# Patient Record
Sex: Male | Born: 1965 | Race: White | Hispanic: No | Smoking: Former smoker
Health system: Southern US, Community
[De-identification: ages and names within clinical notes are randomized; demographics above are authoritative.]

## PROBLEM LIST (undated history)

## (undated) DIAGNOSIS — I10 Essential (primary) hypertension: Secondary | ICD-10-CM

## (undated) DIAGNOSIS — D696 Thrombocytopenia, unspecified: Secondary | ICD-10-CM

## (undated) DIAGNOSIS — I219 Acute myocardial infarction, unspecified: Secondary | ICD-10-CM

## (undated) DIAGNOSIS — E119 Type 2 diabetes mellitus without complications: Secondary | ICD-10-CM

## (undated) DIAGNOSIS — K7460 Unspecified cirrhosis of liver: Secondary | ICD-10-CM

## (undated) DIAGNOSIS — G4730 Sleep apnea, unspecified: Secondary | ICD-10-CM

## (undated) DIAGNOSIS — C801 Malignant (primary) neoplasm, unspecified: Secondary | ICD-10-CM

## (undated) DIAGNOSIS — I209 Angina pectoris, unspecified: Secondary | ICD-10-CM

## (undated) DIAGNOSIS — E785 Hyperlipidemia, unspecified: Secondary | ICD-10-CM

## (undated) HISTORY — PX: TONSILLECTOMY: SUR1361

## (undated) HISTORY — DX: Type 2 diabetes mellitus without complications: E11.9

## (undated) HISTORY — PX: COLONOSCOPY: SHX174

## (undated) HISTORY — PX: UPPER GASTROINTESTINAL ENDOSCOPY: SHX188

## (undated) HISTORY — PX: APPENDECTOMY: SHX54

## (undated) HISTORY — PX: WRIST SURGERY: SHX841

## (undated) HISTORY — PX: REPLACEMENT TOTAL KNEE: SUR1224

## (undated) HISTORY — PX: CARDIAC CATHETERIZATION: SHX172

## (undated) HISTORY — PX: CHOLECYSTECTOMY: SHX55

## (undated) HISTORY — DX: Essential (primary) hypertension: I10

## (undated) HISTORY — DX: Hyperlipidemia, unspecified: E78.5

## (undated) HISTORY — PX: TONSILLECTOMY AND ADENOIDECTOMY: SHX28

## (undated) HISTORY — DX: Acute myocardial infarction, unspecified: I21.9

## (undated) HISTORY — DX: Malignant (primary) neoplasm, unspecified: C80.1

---

## 2021-06-21 ENCOUNTER — Ambulatory Visit: Payer: Self-pay | Admitting: Internal Medicine

## 2021-07-13 DIAGNOSIS — N289 Disorder of kidney and ureter, unspecified: Secondary | ICD-10-CM | POA: Insufficient documentation

## 2021-07-13 DIAGNOSIS — G8929 Other chronic pain: Secondary | ICD-10-CM | POA: Insufficient documentation

## 2021-07-13 DIAGNOSIS — I872 Venous insufficiency (chronic) (peripheral): Secondary | ICD-10-CM | POA: Insufficient documentation

## 2021-07-13 DIAGNOSIS — N400 Enlarged prostate without lower urinary tract symptoms: Secondary | ICD-10-CM | POA: Insufficient documentation

## 2021-07-13 DIAGNOSIS — I2510 Atherosclerotic heart disease of native coronary artery without angina pectoris: Secondary | ICD-10-CM | POA: Insufficient documentation

## 2021-07-13 DIAGNOSIS — R600 Localized edema: Secondary | ICD-10-CM | POA: Insufficient documentation

## 2021-07-13 DIAGNOSIS — Z9989 Dependence on other enabling machines and devices: Secondary | ICD-10-CM | POA: Insufficient documentation

## 2021-07-13 DIAGNOSIS — Z96652 Presence of left artificial knee joint: Secondary | ICD-10-CM | POA: Insufficient documentation

## 2021-07-13 DIAGNOSIS — I8510 Secondary esophageal varices without bleeding: Secondary | ICD-10-CM | POA: Insufficient documentation

## 2021-07-13 DIAGNOSIS — F5101 Primary insomnia: Secondary | ICD-10-CM | POA: Insufficient documentation

## 2021-07-13 DIAGNOSIS — R609 Edema, unspecified: Secondary | ICD-10-CM | POA: Insufficient documentation

## 2021-07-13 DIAGNOSIS — R7989 Other specified abnormal findings of blood chemistry: Secondary | ICD-10-CM | POA: Insufficient documentation

## 2021-07-13 DIAGNOSIS — E790 Hyperuricemia without signs of inflammatory arthritis and tophaceous disease: Secondary | ICD-10-CM | POA: Insufficient documentation

## 2021-07-13 DIAGNOSIS — K7460 Unspecified cirrhosis of liver: Secondary | ICD-10-CM | POA: Insufficient documentation

## 2021-07-19 DIAGNOSIS — E119 Type 2 diabetes mellitus without complications: Secondary | ICD-10-CM | POA: Insufficient documentation

## 2021-07-19 DIAGNOSIS — D696 Thrombocytopenia, unspecified: Secondary | ICD-10-CM | POA: Insufficient documentation

## 2021-07-20 ENCOUNTER — Other Ambulatory Visit: Payer: Self-pay | Admitting: Family Medicine

## 2021-07-20 DIAGNOSIS — K7460 Unspecified cirrhosis of liver: Secondary | ICD-10-CM

## 2021-07-20 DIAGNOSIS — E119 Type 2 diabetes mellitus without complications: Secondary | ICD-10-CM

## 2021-07-27 ENCOUNTER — Other Ambulatory Visit: Payer: Self-pay

## 2021-07-27 ENCOUNTER — Ambulatory Visit
Admission: RE | Admit: 2021-07-27 | Discharge: 2021-07-27 | Disposition: A | Discharge status: Home / Self Care | Payer: Medicare Other | Source: Ambulatory Visit | Attending: Family Medicine | Admitting: Family Medicine

## 2021-07-27 DIAGNOSIS — K7460 Unspecified cirrhosis of liver: Secondary | ICD-10-CM

## 2021-07-27 DIAGNOSIS — E119 Type 2 diabetes mellitus without complications: Secondary | ICD-10-CM

## 2021-07-27 IMAGING — US US ABDOMEN COMPLETE
1 series · 13 of 25 positions shown · non-contrast
Comparison: None.

CLINICAL DATA: Hepatic cirrhosis.

EXAM:
ABDOMEN ULTRASOUND COMPLETE

[Series 1: us abdomen complete · 0.34mm/px · 13 of 137 slices shown]
[im 1/137]
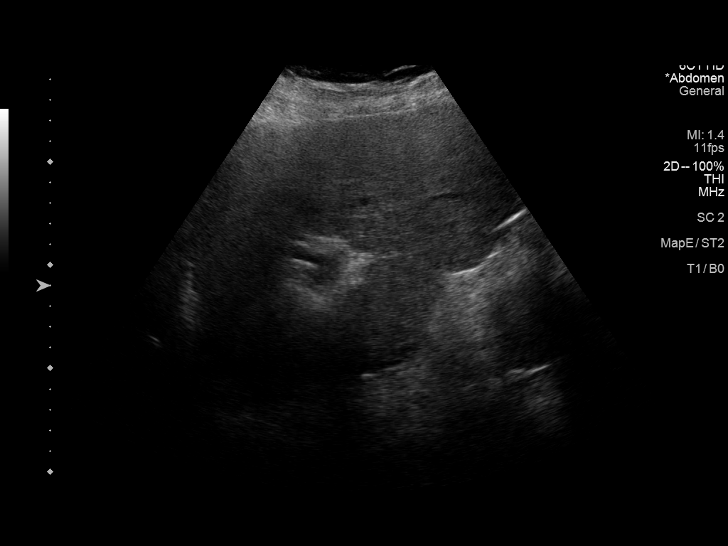
[im 12/137]
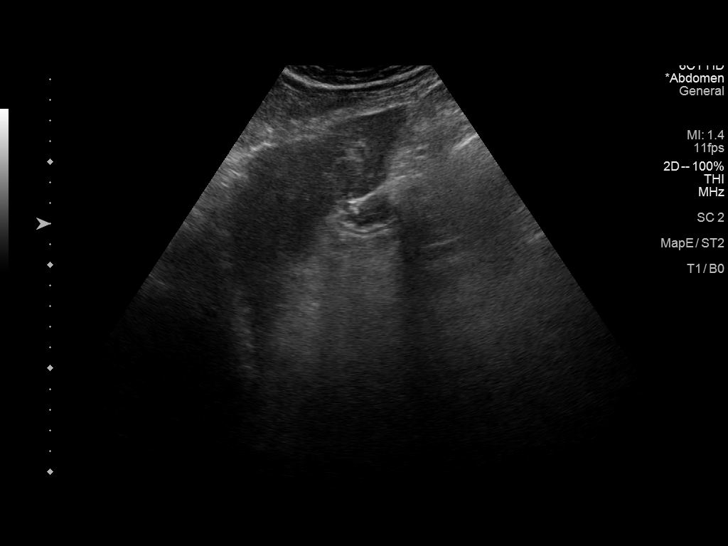
[im 23/137]
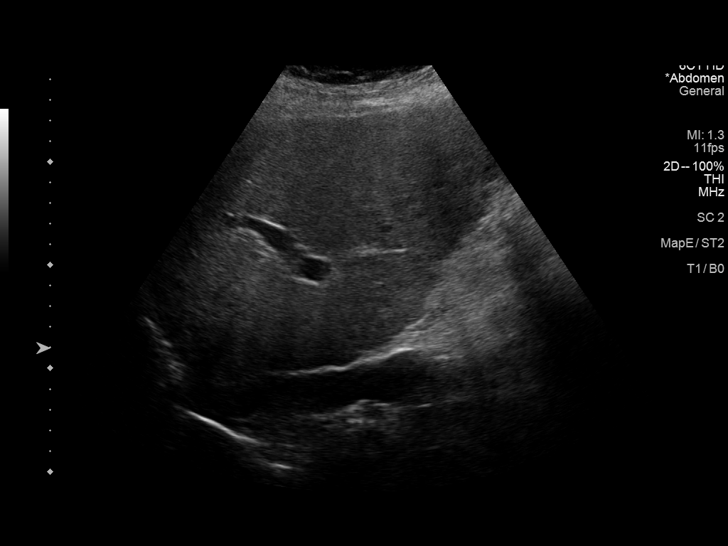
[im 35/137]
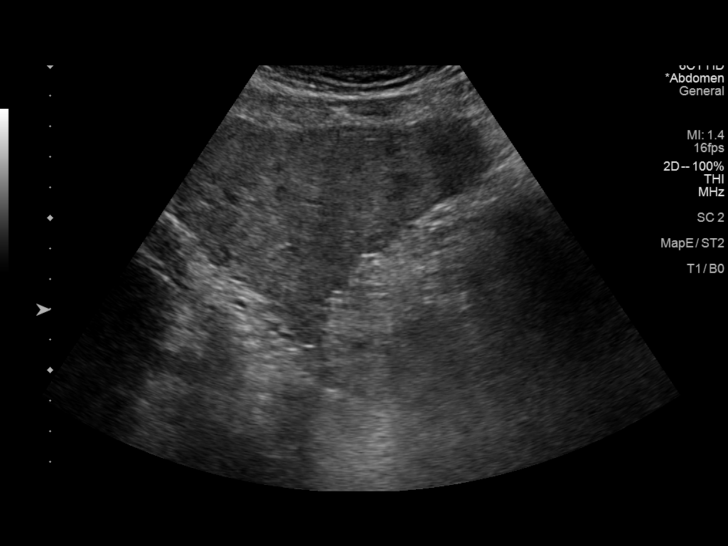
[im 46/137]
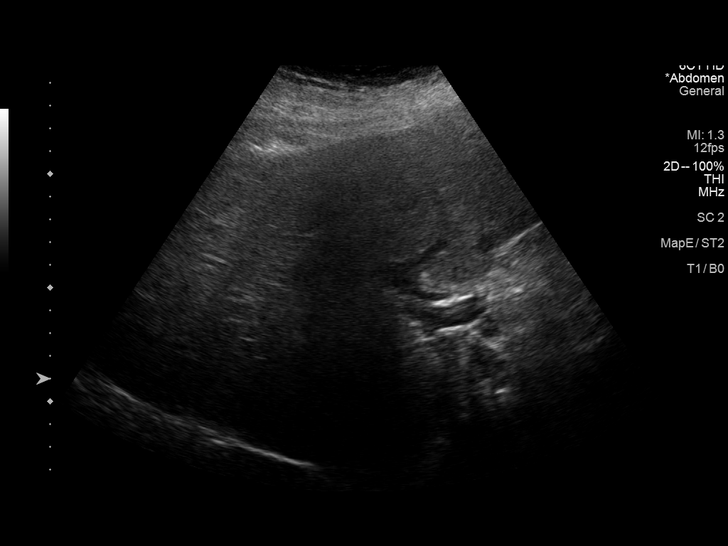
[im 57/137]
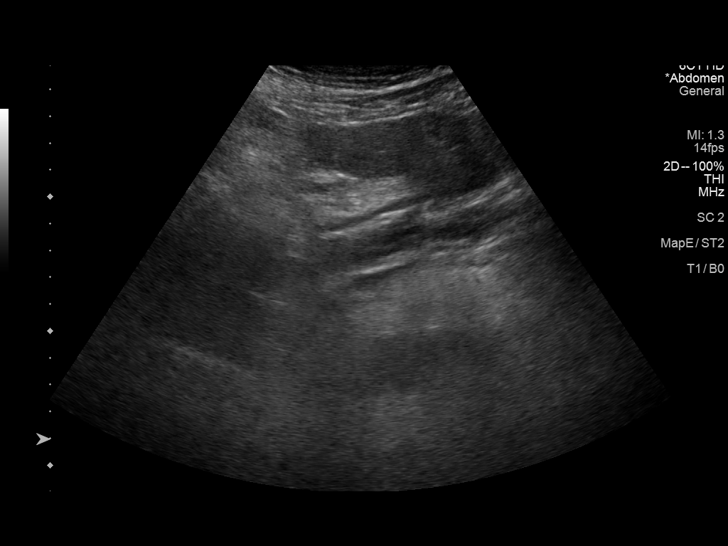
[im 69/137]
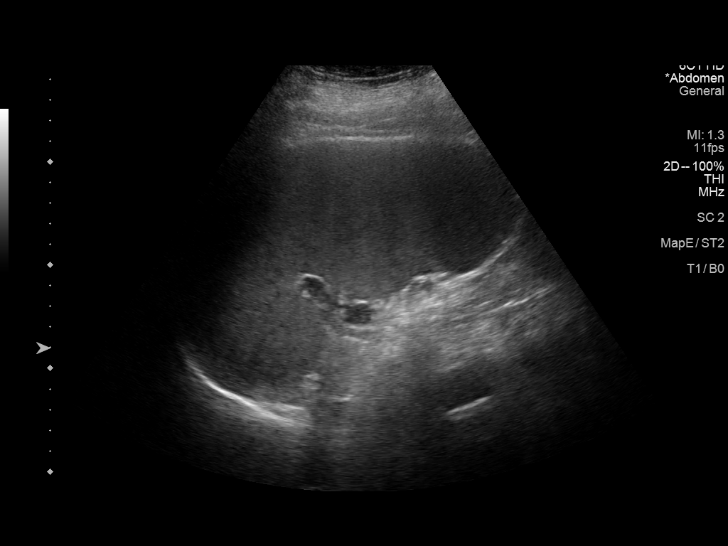
[im 80/137]
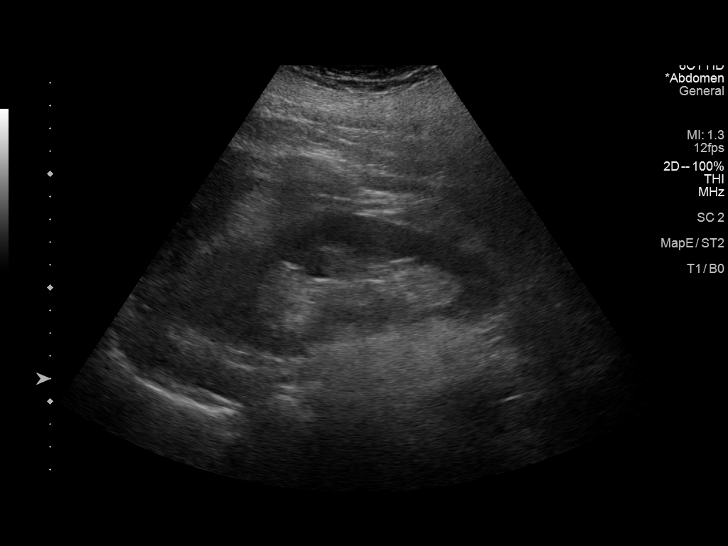
[im 91/137]
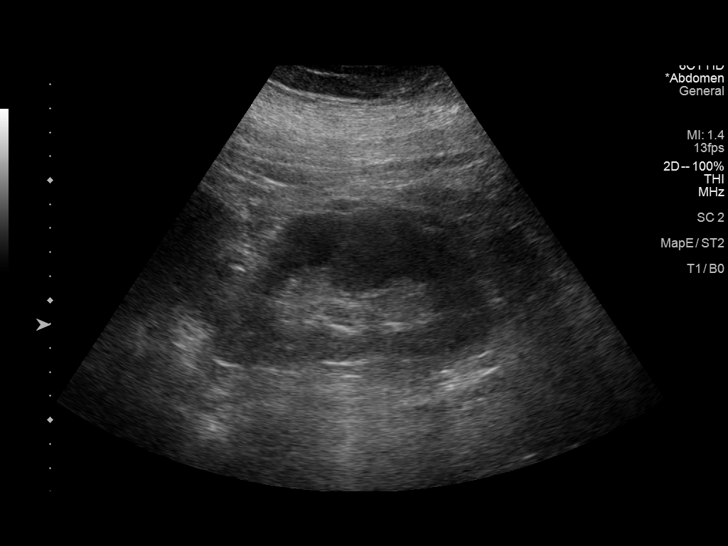
[im 103/137]
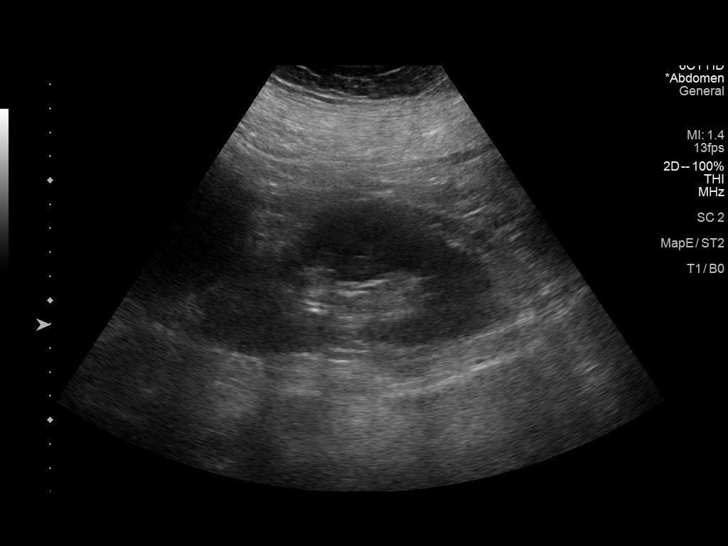
[im 114/137]
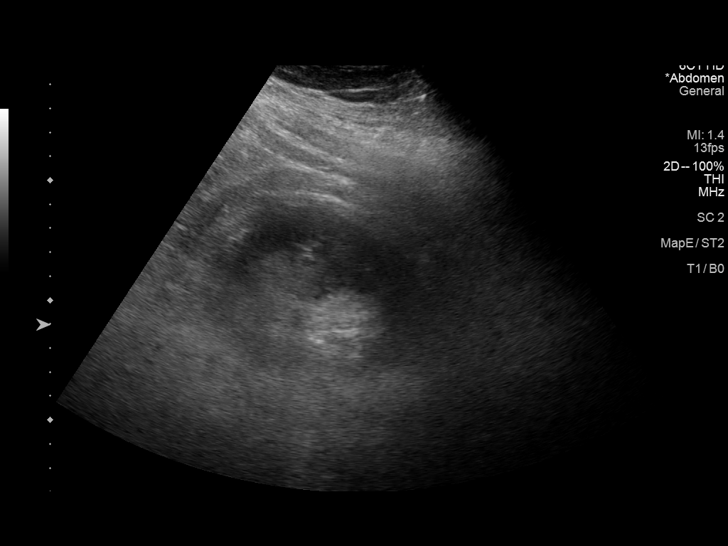
[im 125/137]
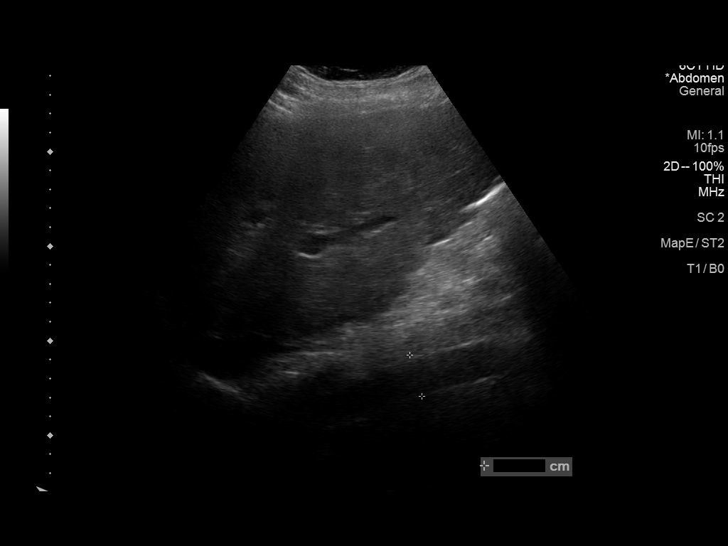
[im 137/137]
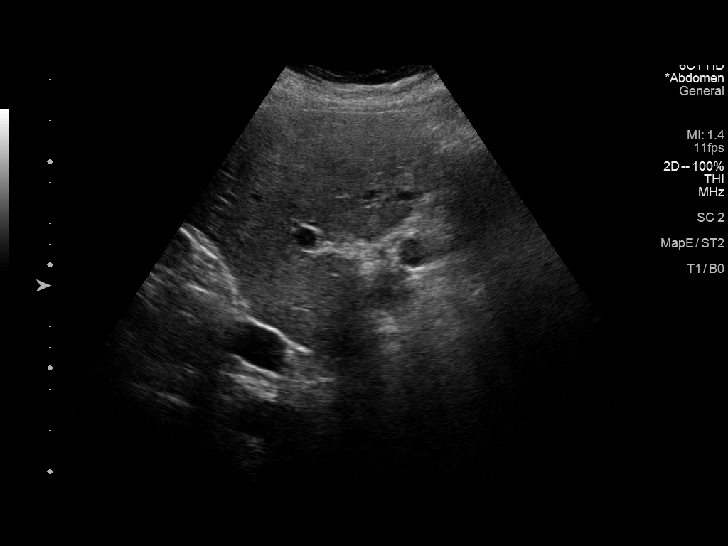

[13 of 25 positions shown; findings below may reference images not displayed]

FINDINGS: Gallbladder: Surgically absent.

Common bile duct: Diameter: 14 mm.

Liver: The liver has a nodular contour it is diffusely heterogeneous
and coarse. There is a 2.6 by 2.2 x 2.4 cm mass in the left lobe.
Portal vein is patent on color Doppler imaging with normal direction
of blood flow towards the liver.

IVC: No abnormality visualized.

Pancreas: Visualized portion unremarkable.

Spleen: Enlarged measuring 14.4 x 14.4 x 12.3 cm.

Right Kidney: Length: 14.9 cm. Echogenicity within normal limits. No
mass or hydronephrosis visualized.

Left Kidney: Length: 12.0 cm. Echogenicity within normal limits. No
hydronephrosis. There is a mildly echogenic heterogeneous mass in
the mid left kidney measuring 2.8 x 3.4 x 3.1 cm.

Abdominal aorta: No aneurysm visualized.  Limited evaluation.

Other findings: No ascites.
IMPRESSION: 1. Cirrhotic liver. 2.4 cm mass in the left lobe of the liver,
indeterminate. Recommend further characterization with MRI.
2. No ascites.
3. Splenomegaly.
4. 3.4 cm echogenic mass in the left kidney is indeterminate.
Recommend further evaluation with MRI.

## 2021-08-01 ENCOUNTER — Ambulatory Visit: Payer: Self-pay | Admitting: Internal Medicine

## 2021-08-01 ENCOUNTER — Other Ambulatory Visit: Payer: Self-pay | Admitting: Family Medicine

## 2021-08-01 DIAGNOSIS — K7460 Unspecified cirrhosis of liver: Secondary | ICD-10-CM

## 2021-08-01 DIAGNOSIS — R160 Hepatomegaly, not elsewhere classified: Secondary | ICD-10-CM

## 2021-08-01 DIAGNOSIS — N2889 Other specified disorders of kidney and ureter: Secondary | ICD-10-CM | POA: Insufficient documentation

## 2021-08-02 ENCOUNTER — Other Ambulatory Visit: Payer: Self-pay

## 2021-08-02 ENCOUNTER — Ambulatory Visit
Admission: RE | Admit: 2021-08-02 | Discharge: 2021-08-02 | Disposition: A | Discharge status: Home / Self Care | Payer: Medicare Other | Source: Ambulatory Visit | Attending: Family Medicine | Admitting: Family Medicine

## 2021-08-02 DIAGNOSIS — M25552 Pain in left hip: Secondary | ICD-10-CM | POA: Insufficient documentation

## 2021-08-02 DIAGNOSIS — I890 Lymphedema, not elsewhere classified: Secondary | ICD-10-CM | POA: Insufficient documentation

## 2021-08-02 DIAGNOSIS — K7460 Unspecified cirrhosis of liver: Secondary | ICD-10-CM

## 2021-08-02 DIAGNOSIS — I898 Other specified noninfective disorders of lymphatic vessels and lymph nodes: Secondary | ICD-10-CM | POA: Insufficient documentation

## 2021-08-02 DIAGNOSIS — R160 Hepatomegaly, not elsewhere classified: Secondary | ICD-10-CM

## 2021-08-02 IMAGING — MR MR ABDOMEN WO/W CM
18 series · 48 of 48 positions shown · IV contrast (multihance)
Comparison: Ultrasound on [DATE]

CLINICAL DATA: Cirrhosis. Possible hepatic and left renal masses on
recent ultrasound.

EXAM:
MRI ABDOMEN WITHOUT AND WITH CONTRAST
TECHNIQUE: Multiplanar multisequence MR imaging of the abdomen was performed
both before and after the administration of intravenous contrast.
CONTRAST:  20mL MULTIHANCE GADOBENATE DIMEGLUMINE 529 MG/ML IV SOLN

[Series 3: T2 · coronal · 5.0mm · 1.95mm/px · 1 of 41 slices shown (1 of 4)]
[im 1/41]
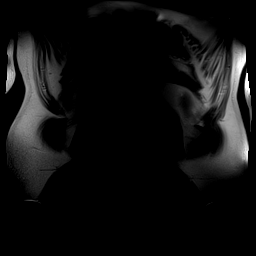

[Series 4: T1 · axial · 3.0mm · 1.41mm/px · z∈[-150,+108]mm · 5 of 176 slices shown]
[im 1/176]
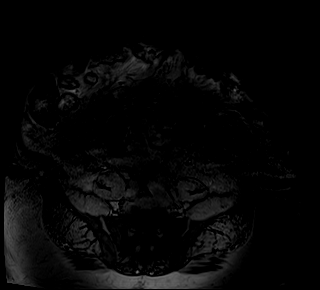
[im 44/176]
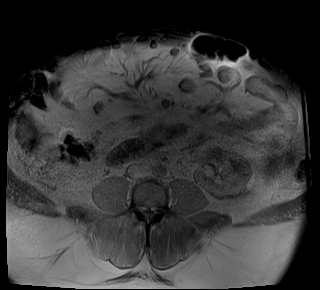
[im 88/176]
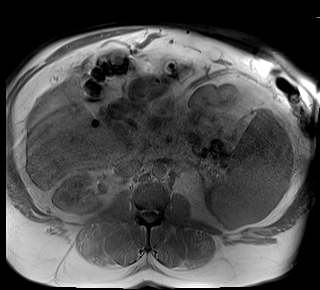
[im 132/176]
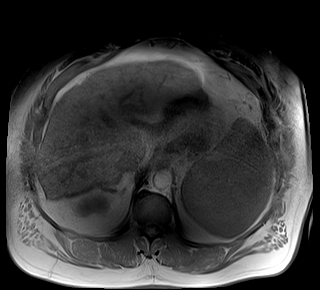
[im 176/176]
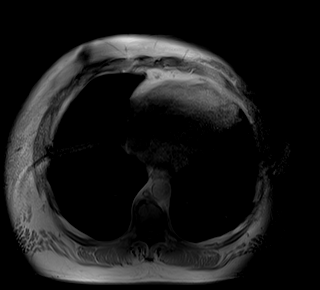

[Series 5: T2 · axial · 5.0mm · 1.76mm/px · z∈[-110,+152]mm · 2 of 45 slices shown (2 of 4)]
[im 1/45]
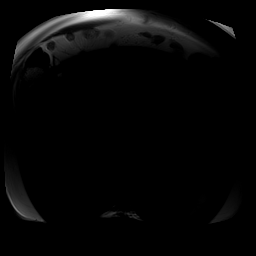
[im 45/45]
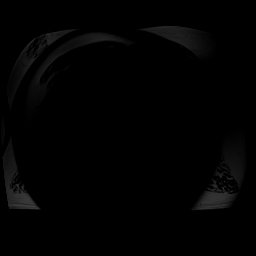

[Series 6: bSSFP · axial · 5.0mm · 1.41mm/px · 1 of 43 slices shown]
[im 1/43]
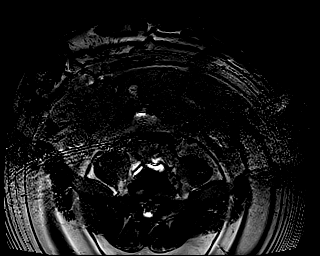

[Series 7: DWI · axial · 5.0mm · 1.68mm/px · z∈[-114,+148]mm · 5 of 135 slices shown (1 of 2)]
[im 1/135]
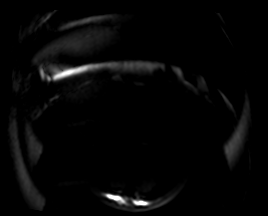
[im 34/135]
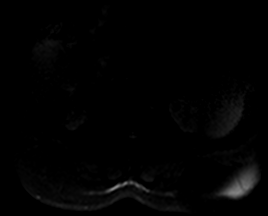
[im 68/135]
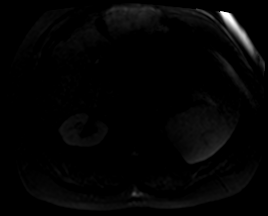
[im 101/135]
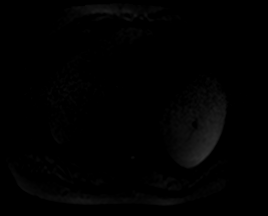
[im 135/135]
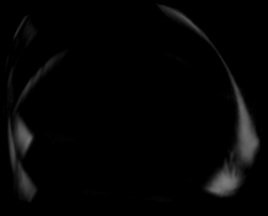

[Series 8: DWI · axial · 5.0mm · 1.68mm/px · z∈[-114,+148]mm · 2 of 45 slices shown (2 of 2)]
[im 1/45]
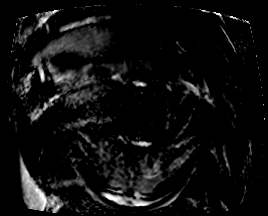
[im 45/45]
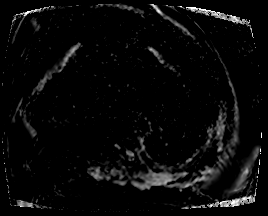

[Series 9: T2 · axial · 6.0mm · 1.41mm/px · 1 of 34 slices shown (3 of 4)]
[im 1/34]
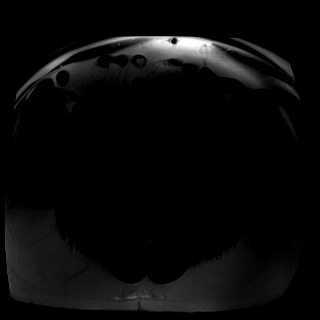

[Series 11: T2 · axial · 6.0mm · 1.41mm/px · 1 of 35 slices shown (4 of 4)]
[im 1/35]
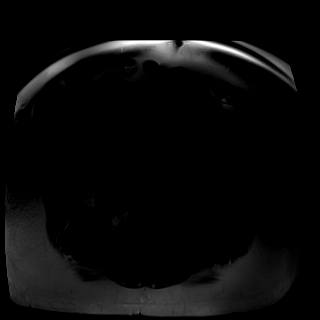

[Series 12: T1 dynamic · axial · non-contrast · 3.0mm · 1.41mm/px · z∈[-145,+113]mm · 3 of 88 slices shown]
[im 1/88]
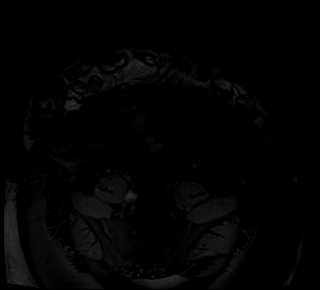
[im 44/88]
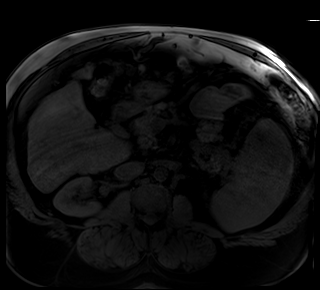
[im 88/88]
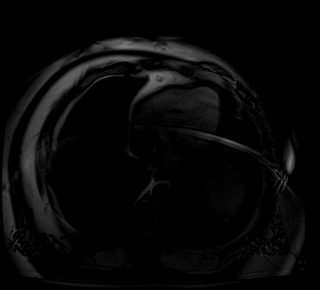

[Series 13: T1 dynamic post-contrast · axial · 3.0mm · 1.41mm/px · z∈[-145,+113]mm · 3 of 88 slices shown (1 of 9)]
[im 1/88]
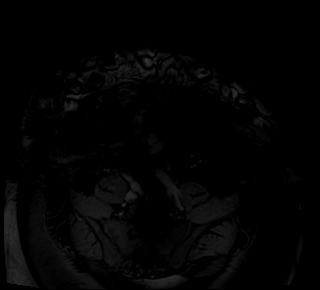
[im 44/88]
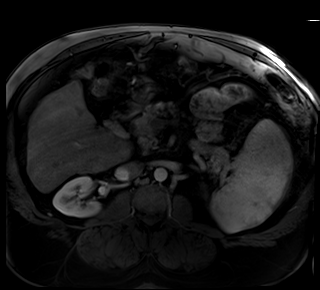
[im 88/88]
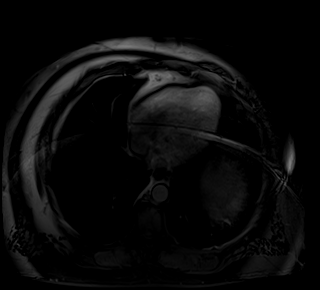

[Series 14: T1 dynamic post-contrast · axial · 3.0mm · 1.41mm/px · z∈[-145,+113]mm · 3 of 88 slices shown (2 of 9)]
[im 1/88]
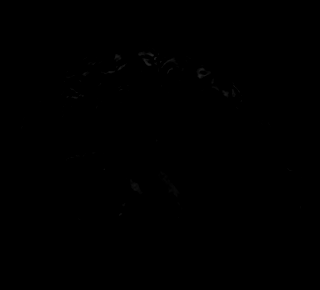
[im 44/88]
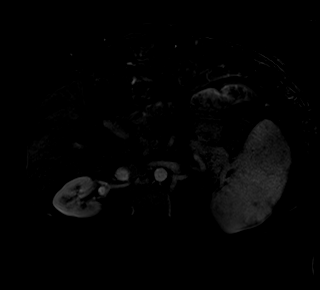
[im 88/88]
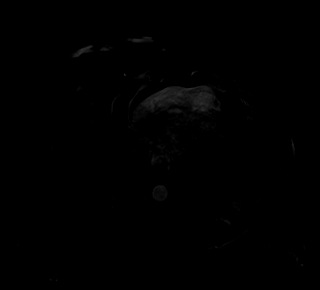

[Series 15: T1 dynamic post-contrast · axial · 3.0mm · 1.41mm/px · z∈[-145,+113]mm · 3 of 88 slices shown (3 of 9)]
[im 1/88]
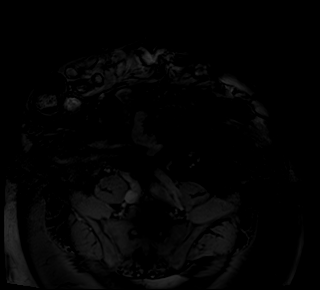
[im 44/88]
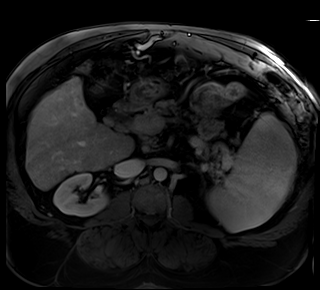
[im 88/88]
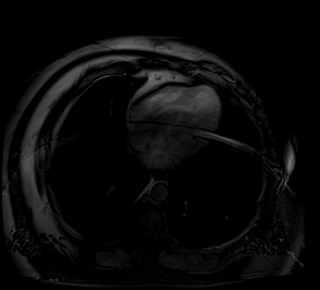

[Series 16: T1 dynamic post-contrast · axial · 3.0mm · 1.41mm/px · z∈[-145,+113]mm · 3 of 88 slices shown (4 of 9)]
[im 1/88]
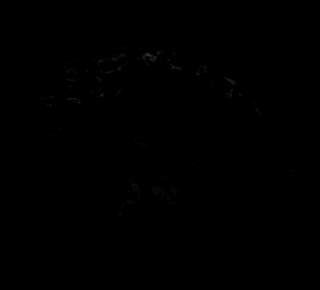
[im 44/88]
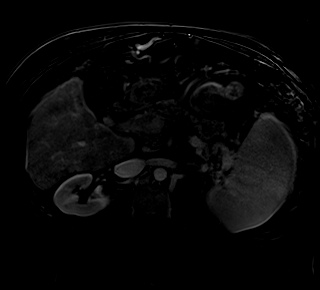
[im 88/88]
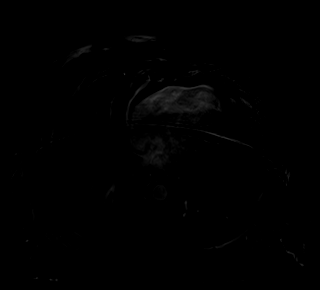

[Series 17: T1 dynamic post-contrast · axial · 3.0mm · 1.41mm/px · z∈[-145,+113]mm · 3 of 88 slices shown (5 of 9)]
[im 1/88]
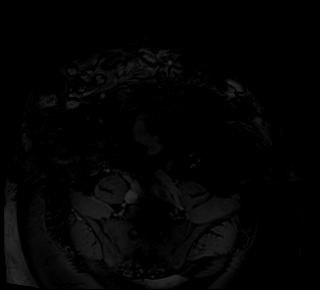
[im 44/88]
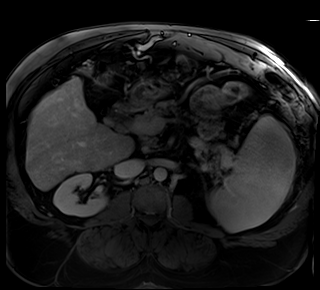
[im 88/88]
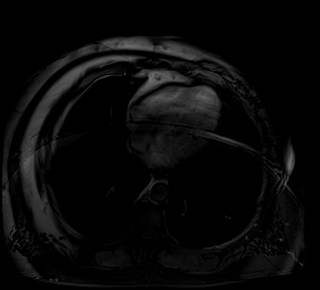

[Series 18: T1 dynamic post-contrast · axial · 3.0mm · 1.41mm/px · z∈[-145,+113]mm · 3 of 88 slices shown (6 of 9)]
[im 1/88]
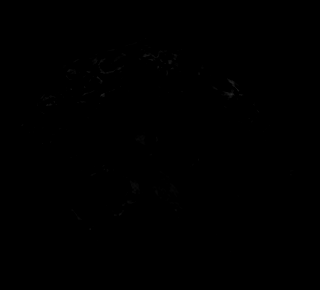
[im 44/88]
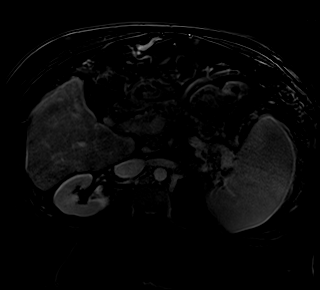
[im 88/88]
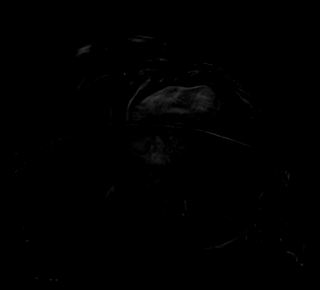

[Series 19: T1 dynamic post-contrast · coronal · 3.0mm · 1.41mm/px · 3 of 88 slices shown (7 of 9)]
[im 1/88]
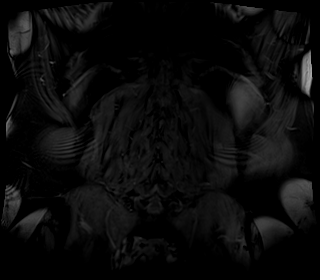
[im 44/88]
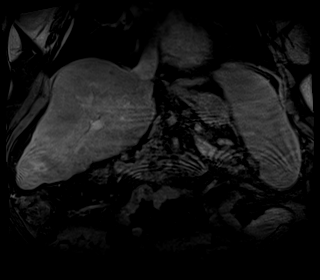
[im 88/88]
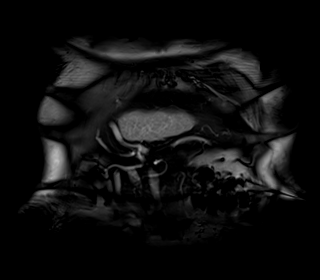

[Series 20: T1 dynamic post-contrast · axial · 3.0mm · 1.41mm/px · z∈[-145,+113]mm · 3 of 88 slices shown (8 of 9)]
[im 1/88]
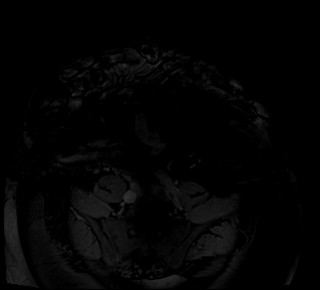
[im 44/88]
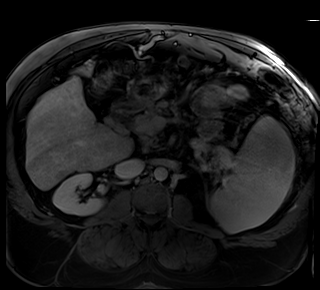
[im 88/88]
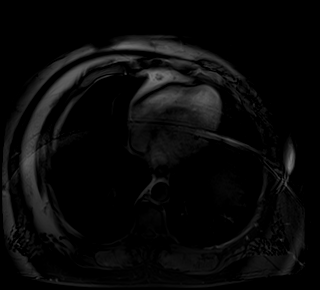

[Series 21: T1 dynamic post-contrast · axial · 3.0mm · 1.41mm/px · z∈[-145,+113]mm · 3 of 88 slices shown (9 of 9)]
[im 1/88]
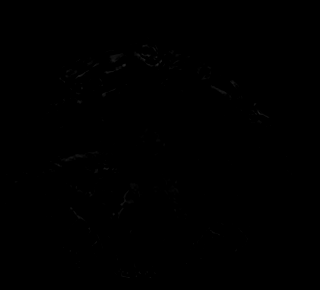
[im 44/88]
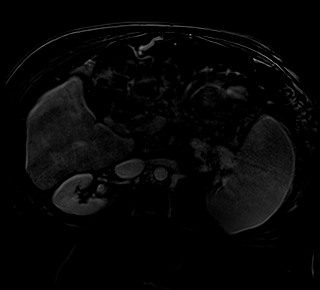
[im 88/88]
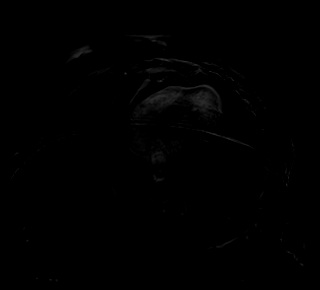

[48 of 48 positions shown; findings below may reference images not displayed]

FINDINGS: Lower chest: No acute findings.

Hepatobiliary: Hepatic cirrhosis is demonstrated. A 2.7 x 2.3 cm
mass is seen in the posterior liver dome in segment 7, which shows
diffuse arterial phase hyperenhancement, contrast washout, and
delayed peripheral rim enhancement (KADHAFI 5). A 9 mm hypervascular
lesion is also seen showing arterial phase hyperenhancement in
segment 4A of the left lobe, and this also shows contrast washout
and delayed capsular enhancement (e.g. Image 20/20). (KADHAFI 4) a 6
mm nodule is also seen in segment [DATE] on image 35/13, which shows
arterial phase hyperenhancement, but no evidence of contrast washout
or delayed capsular enhancement. (KADHAFI 3).

Pancreas:  No mass or inflammatory changes.

Spleen: Moderate splenomegaly measuring approximately 19 cm in
length. No splenic masses identified.

Adrenals/Urinary Tract: No masses identified. No evidence of
hydronephrosis.

Stomach/Bowel: Visualized portion unremarkable.

Vascular/Lymphatic: No pathologically enlarged lymph nodes
identified. No acute vascular findings. Recanalization of
paraumbilical veins, consistent with portal venous hypertension.

Other:  No evidence of ascites.

Musculoskeletal:  No suspicious bone lesions identified.
IMPRESSION: Hepatic cirrhosis, with findings of portal venous hypertension. No
evidence of ascites.

2.7 cm hypervascular mass in posterior liver dome in segment 7.
(KADHAFI Category 5: Definitely HCC)

9 mm hypervascular mass in segment 4A of the left hepatic lobe.
(KADHAFI Category 4: Probably HCC)

6 mm hypervascular nodule in the anterior segment right hepatic
lobe. (KADHAFI Category 3: Intermediate probability of malignancy)

No evidence of abdominal metastatic disease.

No evidence of left renal mass.

## 2021-08-02 MED ORDER — GADOBENATE DIMEGLUMINE 529 MG/ML IV SOLN
20.0000 mL | Freq: Once | INTRAVENOUS | Status: AC | PRN
  Administered 2021-08-02: 20 mL via INTRAVENOUS

## 2021-08-14 DIAGNOSIS — C220 Liver cell carcinoma: Secondary | ICD-10-CM | POA: Insufficient documentation

## 2021-08-16 DIAGNOSIS — K766 Portal hypertension: Secondary | ICD-10-CM | POA: Insufficient documentation

## 2021-09-08 DIAGNOSIS — R600 Localized edema: Secondary | ICD-10-CM | POA: Insufficient documentation

## 2021-09-22 ENCOUNTER — Emergency Department
Admission: EM | Admit: 2021-09-22 | Discharge: 2021-09-22 | Disposition: A | Discharge status: Home / Self Care | Payer: Medicare Other | Attending: Emergency Medicine | Admitting: Emergency Medicine

## 2021-09-22 ENCOUNTER — Emergency Department: Payer: Medicare Other

## 2021-09-22 ENCOUNTER — Other Ambulatory Visit: Payer: Self-pay

## 2021-09-22 DIAGNOSIS — E785 Hyperlipidemia, unspecified: Secondary | ICD-10-CM | POA: Insufficient documentation

## 2021-09-22 DIAGNOSIS — I890 Lymphedema, not elsewhere classified: Secondary | ICD-10-CM | POA: Diagnosis not present

## 2021-09-22 DIAGNOSIS — G4733 Obstructive sleep apnea (adult) (pediatric): Secondary | ICD-10-CM | POA: Insufficient documentation

## 2021-09-22 DIAGNOSIS — H532 Diplopia: Secondary | ICD-10-CM | POA: Insufficient documentation

## 2021-09-22 DIAGNOSIS — I252 Old myocardial infarction: Secondary | ICD-10-CM | POA: Insufficient documentation

## 2021-09-22 DIAGNOSIS — D696 Thrombocytopenia, unspecified: Secondary | ICD-10-CM | POA: Diagnosis not present

## 2021-09-22 DIAGNOSIS — I2510 Atherosclerotic heart disease of native coronary artery without angina pectoris: Secondary | ICD-10-CM | POA: Insufficient documentation

## 2021-09-22 DIAGNOSIS — E119 Type 2 diabetes mellitus without complications: Secondary | ICD-10-CM | POA: Diagnosis not present

## 2021-09-22 DIAGNOSIS — C220 Liver cell carcinoma: Secondary | ICD-10-CM | POA: Insufficient documentation

## 2021-09-22 DIAGNOSIS — H4922 Sixth [abducent] nerve palsy, left eye: Secondary | ICD-10-CM | POA: Diagnosis not present

## 2021-09-22 DIAGNOSIS — I10 Essential (primary) hypertension: Secondary | ICD-10-CM | POA: Diagnosis not present

## 2021-09-22 LAB — CBC
HCT: 40.5 % (ref 39.0–52.0)
Hemoglobin: 14.3 g/dL (ref 13.0–17.0)
MCH: 31.6 pg (ref 26.0–34.0)
MCHC: 35.3 g/dL (ref 30.0–36.0)
MCV: 89.4 fL (ref 80.0–100.0)
Platelets: 63 10*3/uL — ABNORMAL LOW (ref 150–400)
RBC: 4.53 MIL/uL (ref 4.22–5.81)
RDW: 13.2 % (ref 11.5–15.5)
WBC: 4.7 10*3/uL (ref 4.0–10.5)
nRBC: 0 % (ref 0.0–0.2)

## 2021-09-22 LAB — COMPREHENSIVE METABOLIC PANEL
ALT: 40 U/L (ref 0–44)
AST: 71 U/L — ABNORMAL HIGH (ref 15–41)
Albumin: 3.4 g/dL — ABNORMAL LOW (ref 3.5–5.0)
Alkaline Phosphatase: 135 U/L — ABNORMAL HIGH (ref 38–126)
Anion gap: 5 (ref 5–15)
BUN: 13 mg/dL (ref 6–20)
CO2: 26 mmol/L (ref 22–32)
Calcium: 8.5 mg/dL — ABNORMAL LOW (ref 8.9–10.3)
Chloride: 107 mmol/L (ref 98–111)
Creatinine, Ser: 0.85 mg/dL (ref 0.61–1.24)
GFR, Estimated: >60 mL/min (ref >60)
Glucose, Bld: 63 mg/dL — ABNORMAL LOW (ref 70–99)
Potassium: 3.3 mmol/L — ABNORMAL LOW (ref 3.5–5.1)
Sodium: 138 mmol/L (ref 135–145)
Total Bilirubin: 2.2 mg/dL — ABNORMAL HIGH (ref 0.3–1.2)
Total Protein: 7.5 g/dL (ref 6.5–8.1)

## 2021-09-22 LAB — CSF CELL COUNT WITH DIFFERENTIAL
Eosinophils, CSF: 0 %
Eosinophils, CSF: 0 %
Lymphs, CSF: 0 %
Lymphs, CSF: 57 %
Monocyte-Macrophage-Spinal Fluid: 14 %
Monocyte-Macrophage-Spinal Fluid: 50 %
Other Cells, CSF: 0
Other Cells, CSF: 0
RBC Count, CSF: 57 /mm3 — ABNORMAL HIGH (ref 0–3)
RBC Count, CSF: 5772 /mm3 — ABNORMAL HIGH (ref 0–3)
Segmented Neutrophils-CSF: 29 %
Segmented Neutrophils-CSF: 50 %
Tube #: 1
Tube #: 4
WBC, CSF: 43 /mm3 (ref 0–5)
WBC, CSF: 8 /mm3 — ABNORMAL HIGH (ref 0–5)

## 2021-09-22 LAB — PROTIME-INR
INR: 1.3 — ABNORMAL HIGH (ref 0.8–1.2)
Prothrombin Time: 15.8 seconds — ABNORMAL HIGH (ref 11.4–15.2)

## 2021-09-22 LAB — GLUCOSE, CSF: Glucose, CSF: 56 mg/dL (ref 40–70)

## 2021-09-22 LAB — TSH: TSH: 2.089 u[IU]/mL (ref 0.350–4.500)

## 2021-09-22 LAB — PROTEIN, CSF: Total  Protein, CSF: 49 mg/dL — ABNORMAL HIGH (ref 15–45)

## 2021-09-22 IMAGING — RF DG FLUORO GUIDE SPINAL/SI JT INJ*L*
1 series · 1 of 1 positions shown · non-contrast
Comparison: MR brain, [DATE]

CLINICAL DATA: Suspected increased intracranial pressure, recent
diagnosis liver cancer, vision changes

EXAM:
DIAGNOSTIC LUMBAR PUNCTURE UNDER FLUOROSCOPIC GUIDANCE

[Series 1: cp_standard · 0.17mm/px · 1 of 1 slices shown]
[im 1/1]
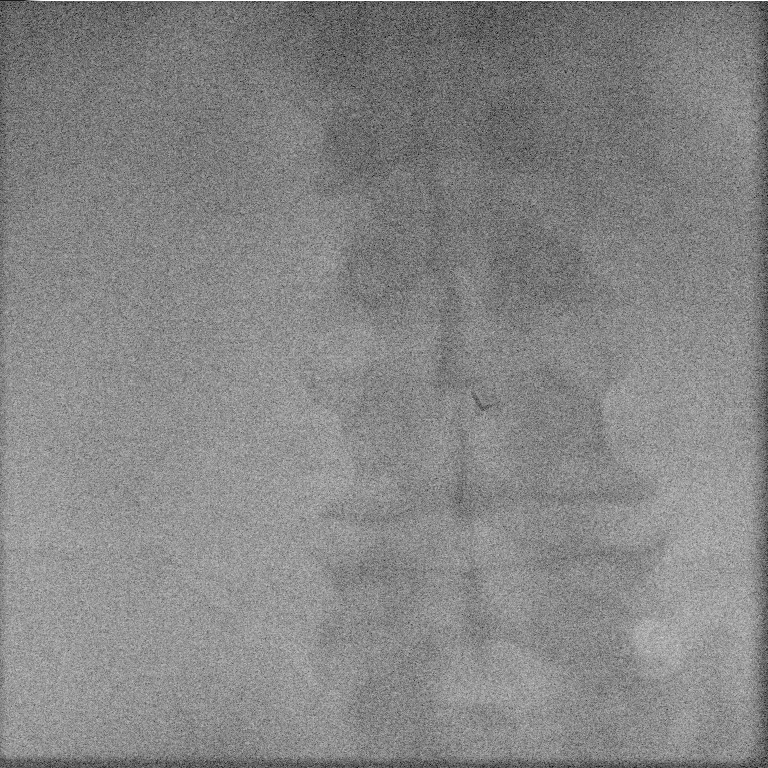

[1 of 1 positions shown; findings below may reference images not displayed]

FLUOROSCOPY TIME:  Fluoroscopy Time:  [DATE]

Number of Acquired Spot Images: 1

PROCEDURE:
Lumbar puncture was performed by BOSCO, PA, and supervised
directly by BOSCO. Informed consent was obtained from
the patient prior to the procedure, including potential
complications of headache, allergy, and pain. With the patient
prone, the lower back was prepped with Betadine. 1% Lidocaine was
used for local anesthesia. Lumbar puncture was performed at the
L3-L4 level using a 6 inch 20 gauge needle with return of
blood-tinged CSF. Opening pressure measurement was attempted, but
due to low flow from the catheter, could not be accurately assessed
although elevated intracranial pressure is essentially excluded. 7
ml of CSF were obtained for laboratory studies. The patient
tolerated the procedure well and there were no apparent
complications.
IMPRESSION: Successful fluoroscopic guided lumbar puncture. Laboratory specimens
submitted. No immediate complication.

## 2021-09-22 IMAGING — MR MR HEAD WO/W CM
16 series · 48 of 48 positions shown · IV contrast (gadavist)
Comparison: None.

CLINICAL DATA: Diplopia

EXAM:
MRI HEAD WITHOUT AND WITH CONTRAST
TECHNIQUE: Multiplanar, multiecho pulse sequences of the brain and surrounding
structures were obtained without and with intravenous contrast.
CONTRAST:  10mL GADAVIST GADOBUTROL 1 MMOL/ML IV SOLN

[Series 5: ax dwi_tracew · axial · 3.0mm · 0.65mm/px · z∈[-45,+104]mm · 3 of 48 slices shown]
[im 1/48]
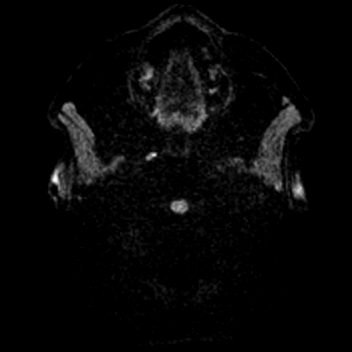
[im 24/48]
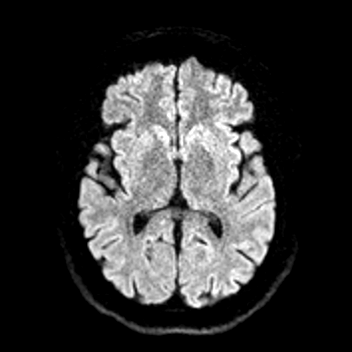
[im 48/48]
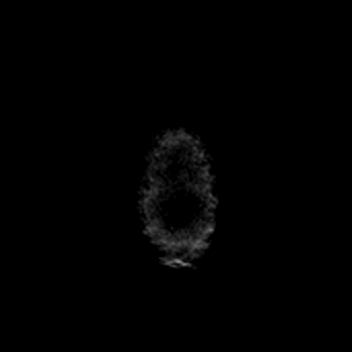

[Series 6: ax dwi_adc · axial · 3.0mm · 0.65mm/px · z∈[-45,+104]mm · 2 of 48 slices shown]
[im 1/48]
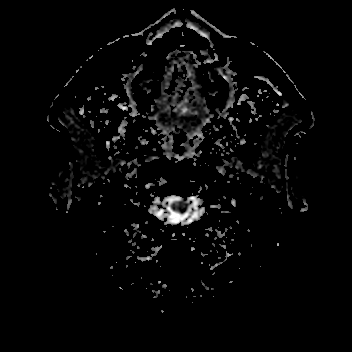
[im 48/48]
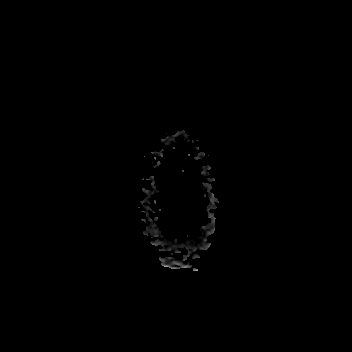

[Series 7: cor dwi_tracew · coronal · 5.0mm · 0.68mm/px · 2 of 40 slices shown]
[im 1/40]
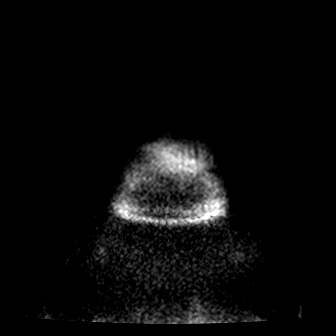
[im 40/40]
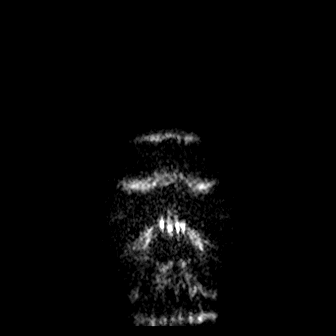

[Series 8: cor dwi_adc · coronal · 5.0mm · 0.68mm/px · 2 of 40 slices shown]
[im 1/40]
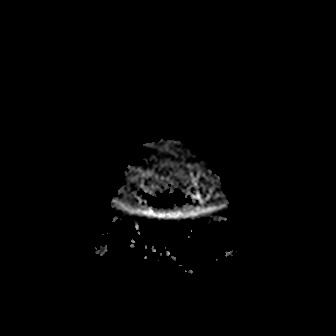
[im 40/40]
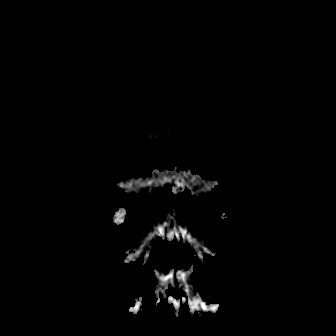

[Series 9: T1 · sagittal · 5.0mm · 0.62mm/px · 1 of 25 slices shown (1 of 2)]
[im 1/25]
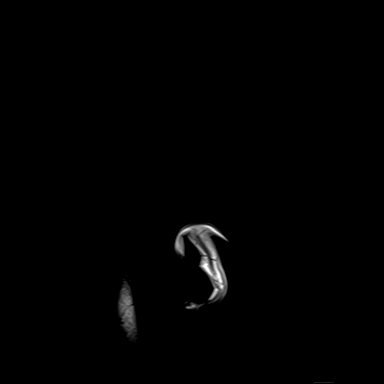

[Series 10: T2 · axial · 5.0mm · 0.53mm/px · 1 of 27 slices shown (1 of 2)]
[im 1/27]
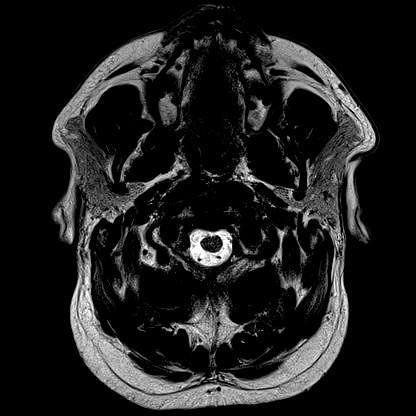

[Series 11: mag_images · axial · 3.0mm · 0.90mm/px · z∈[-55,+115]mm · 3 of 60 slices shown]
[im 1/60]
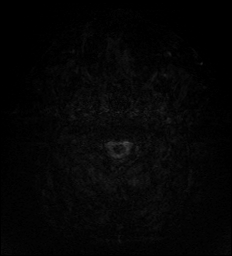
[im 30/60]
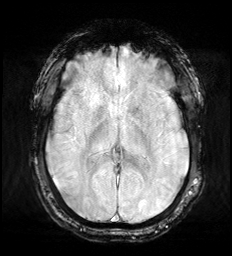
[im 60/60]
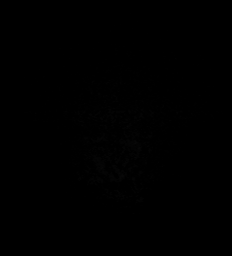

[Series 12: pha_images · axial · 3.0mm · 0.90mm/px · z∈[-55,+112]mm · 3 of 59 slices shown]
[im 1/59]
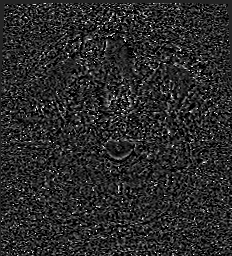
[im 30/59]
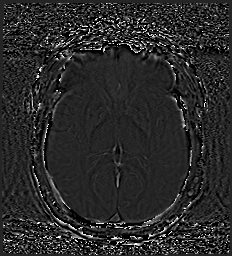
[im 59/59]
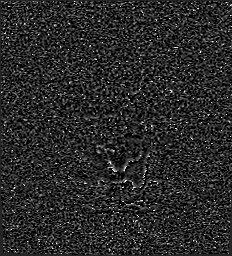

[Series 13: swi_images · axial · 3.0mm · 0.90mm/px · z∈[-55,+115]mm · 3 of 60 slices shown]
[im 1/60]
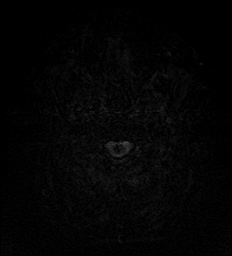
[im 30/60]
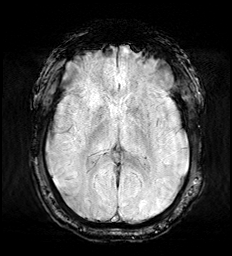
[im 60/60]
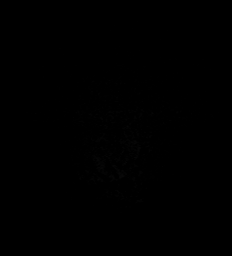

[Series 14: mip_images(sw) · axial · 24.0mm · 0.90mm/px · z∈[-45,+105]mm · 3 of 53 slices shown]
[im 1/53]
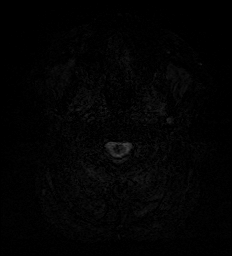
[im 27/53]
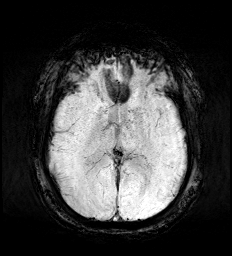
[im 53/53]
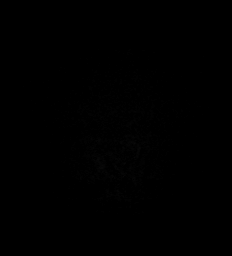

[Series 15: FLAIR · axial · 3.0mm · 0.53mm/px · z∈[-49,+106]mm · 3 of 55 slices shown]
[im 1/55]
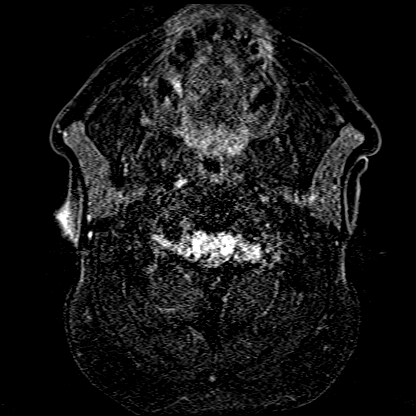
[im 28/55]
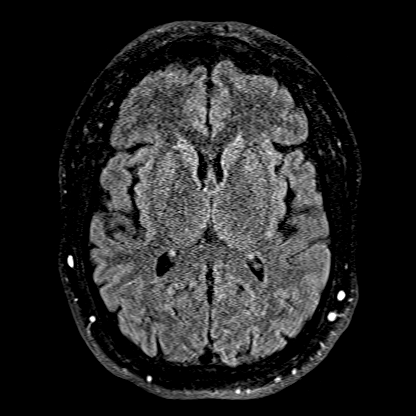
[im 55/55]
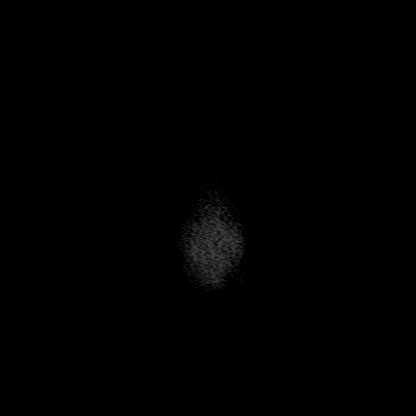

[Series 16: T1 · axial · 1.0mm · 0.98mm/px · z∈[-46,+120]mm · 9 of 176 slices shown (2 of 2)]
[im 1/176]
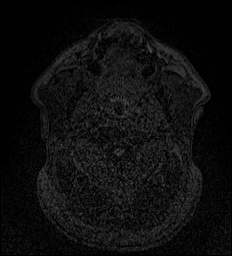
[im 22/176]
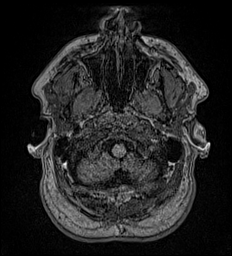
[im 44/176]
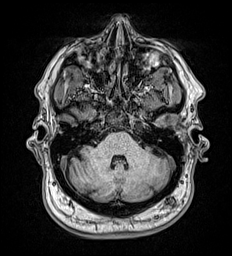
[im 66/176]
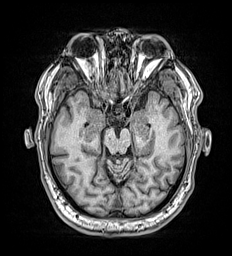
[im 88/176]
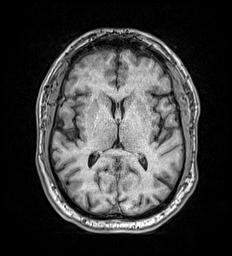
[im 110/176]
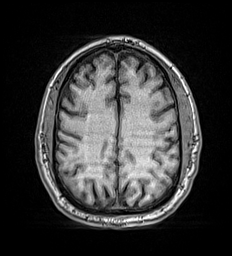
[im 132/176]
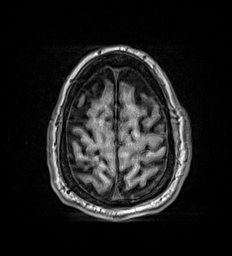
[im 154/176]
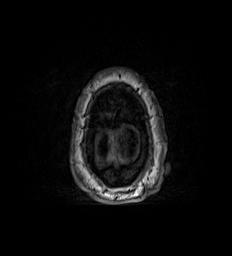
[im 176/176]
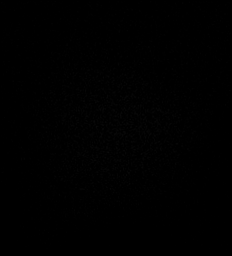

[Series 17: T2 · coronal · 5.0mm · 0.45mm/px · 2 of 31 slices shown (2 of 2)]
[im 1/31]
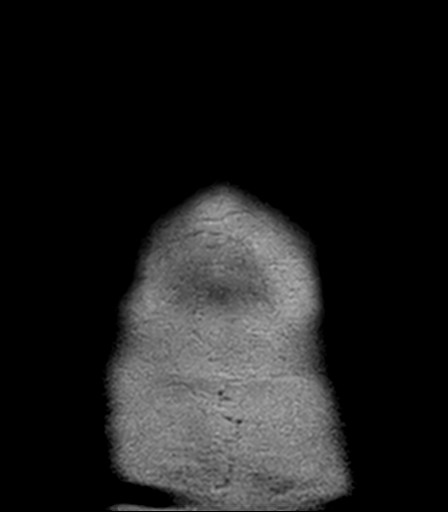
[im 31/31]
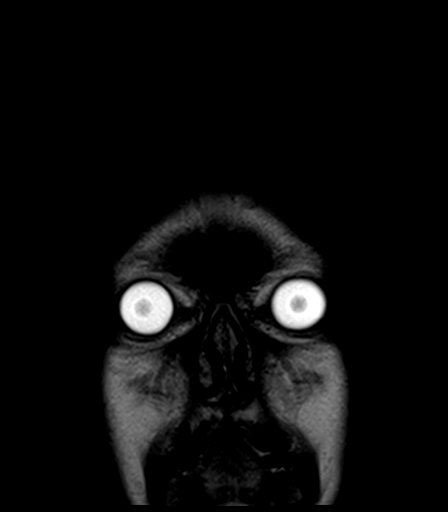

[Series 18: T1 post-contrast · axial · 1.0mm · 0.98mm/px · z∈[-46,+120]mm · 9 of 176 slices shown (1 of 3)]
[im 1/176]
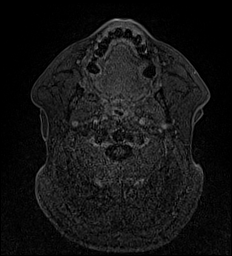
[im 22/176]
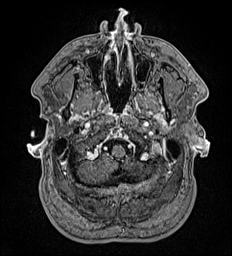
[im 44/176]
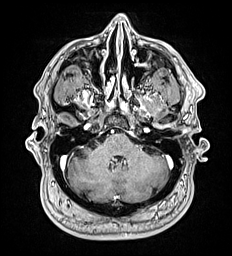
[im 66/176]
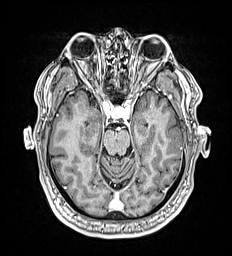
[im 88/176]
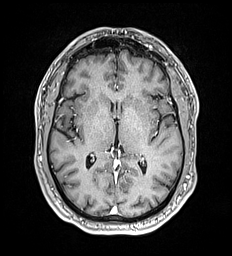
[im 110/176]
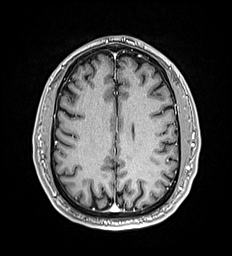
[im 132/176]
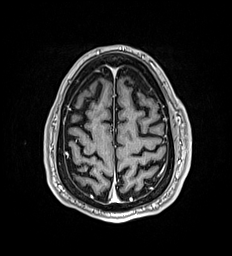
[im 154/176]
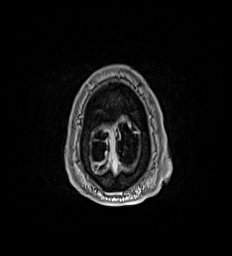
[im 176/176]
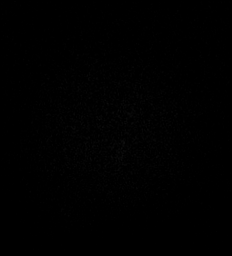

[Series 19: T1 post-contrast · coronal · 5.0mm · 0.57mm/px · 1 of 29 slices shown (2 of 3)]
[im 1/29]
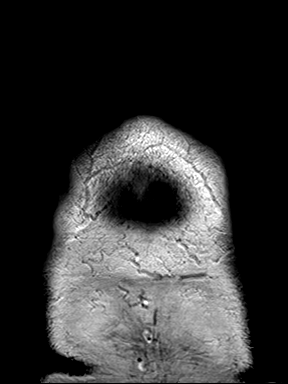

[Series 20: T1 post-contrast · sagittal · 5.0mm · 0.62mm/px · 1 of 25 slices shown (3 of 3)]
[im 1/25]
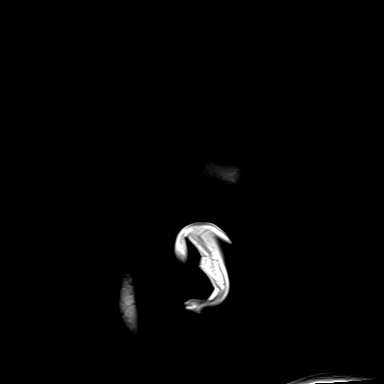

[48 of 48 positions shown; findings below may reference images not displayed]

FINDINGS: Brain: There is no acute infarction or intracranial hemorrhage.
There is no intracranial mass, mass effect, or edema. There is no
hydrocephalus or extra-axial fluid collection. Ventricles and sulci
are within normal limits in size and configuration. No abnormal
enhancement.

Vascular: Major vessel flow voids at the skull base are preserved.

Skull and upper cervical spine: Normal marrow signal is preserved.

Sinuses/Orbits: Mild mucosal thickening.  Orbits are unremarkable.

Other: Sella is unremarkable.  Mastoid air cells are clear.
IMPRESSION: No evidence of recent infarction, hemorrhage, or mass. No abnormal
enhancement.

## 2021-09-22 IMAGING — CT CT HEAD W/O CM
4 series · 17 of 47 positions shown, 19 images · non-contrast
Comparison: None.

CLINICAL DATA: Vision loss.

EXAM:
CT HEAD WITHOUT CONTRAST
TECHNIQUE: Contiguous axial images were obtained from the base of the skull
through the vertex without intravenous contrast.

[Series 2: head bone · axial · 0.43mm/px · z∈[+320,+376]mm · 4 of 82 slices shown]
[im 9/82  bone]
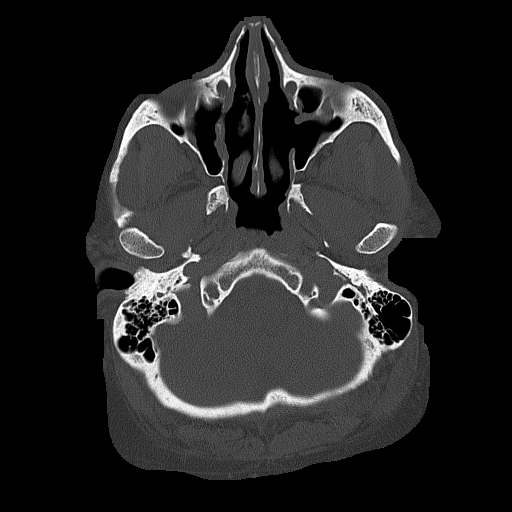
[im 17/82  bone]
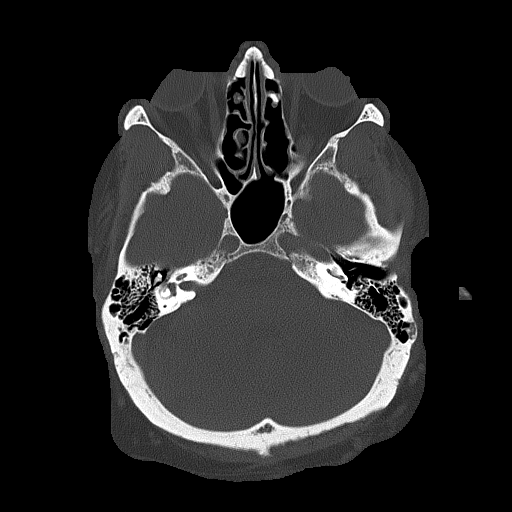
[im 25/82  bone]
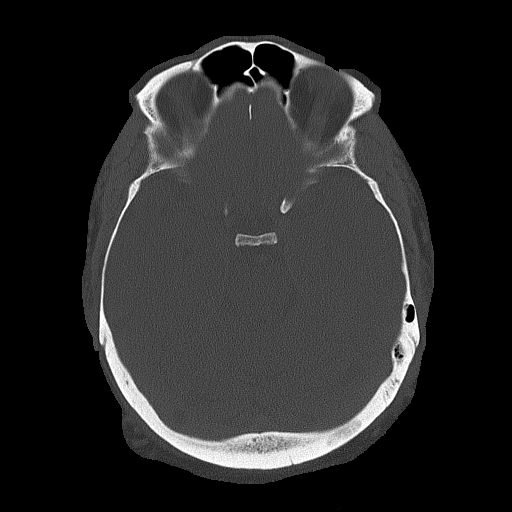
[im 37/82  bone]
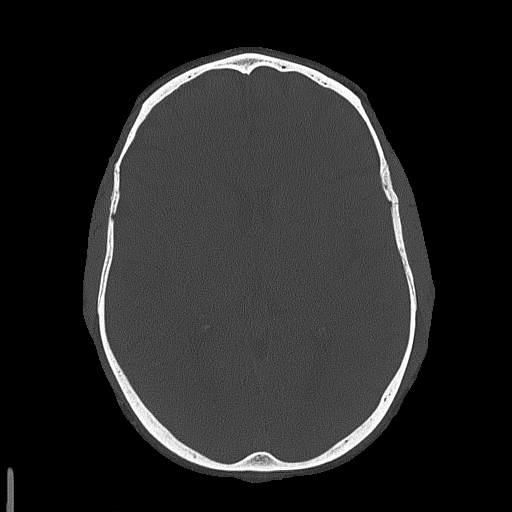

[Series 3: head wo · axial · 0.43mm/px · z∈[+324,+444]mm · 7 of 33 slices shown, 9 images]
[im 5/33  brain]
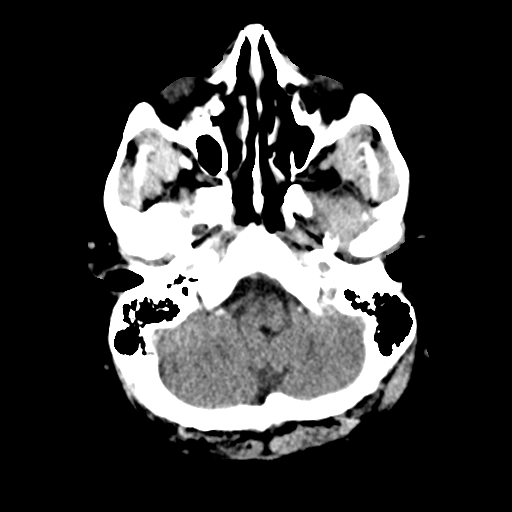
[im 5/33  bone]
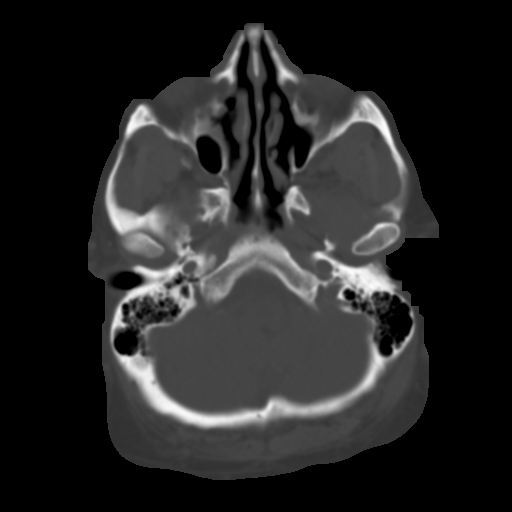
[im 9/33  brain]
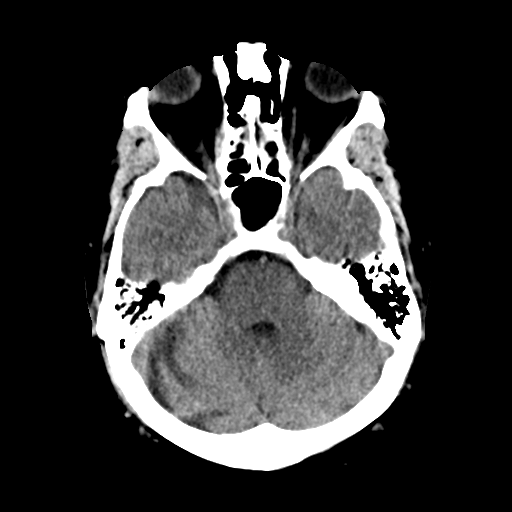
[im 13/33  brain]
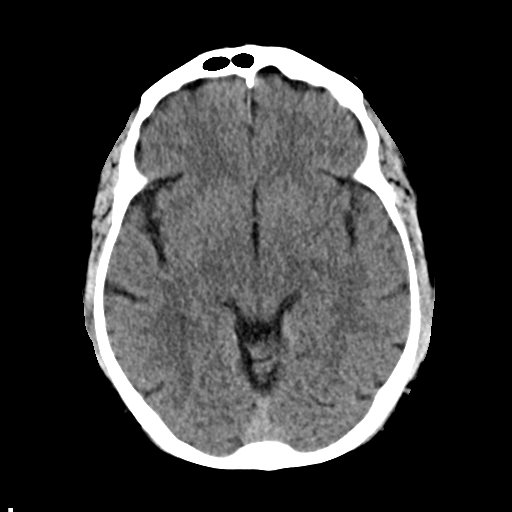
[im 17/33  brain]
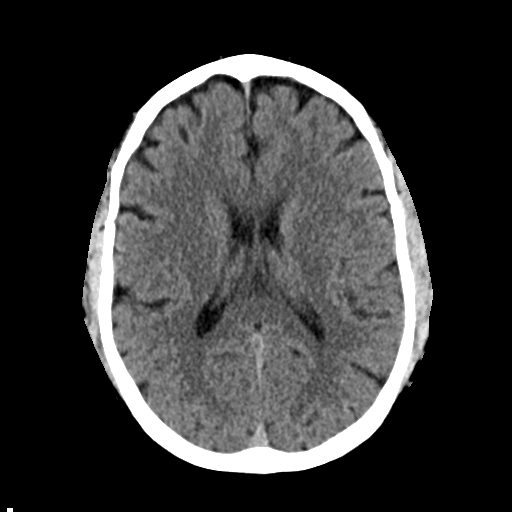
[im 21/33  brain]
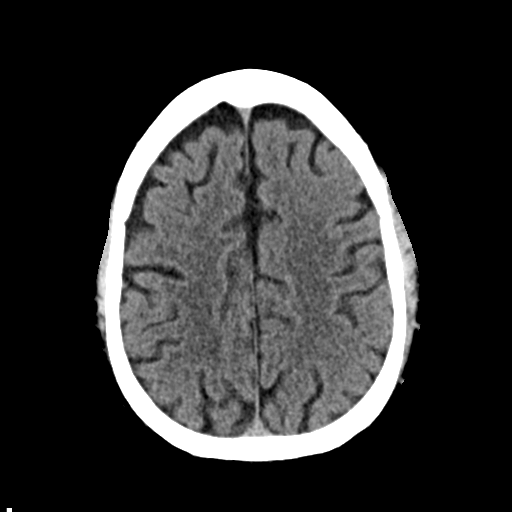
[im 21/33  bone]
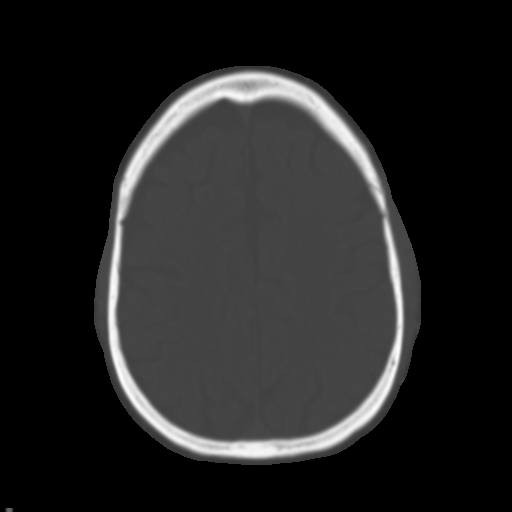
[im 25/33  brain]
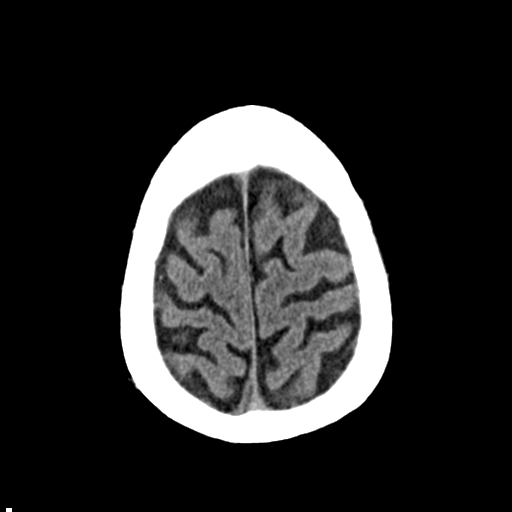
[im 29/33  brain]
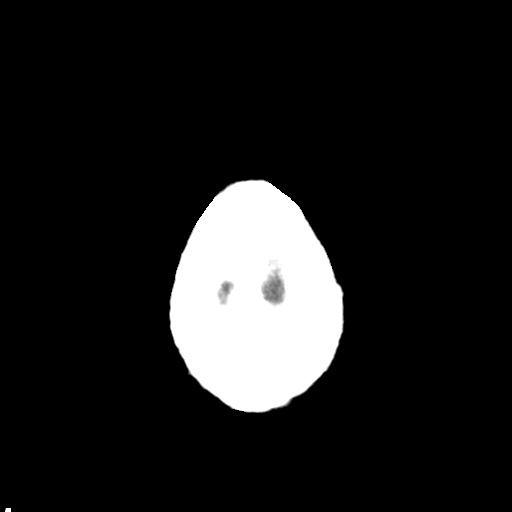

[Series 4: coronal soft tissue · coronal · 0.32mm/px · 3 of 65 slices shown]
[im 22/65  brain]
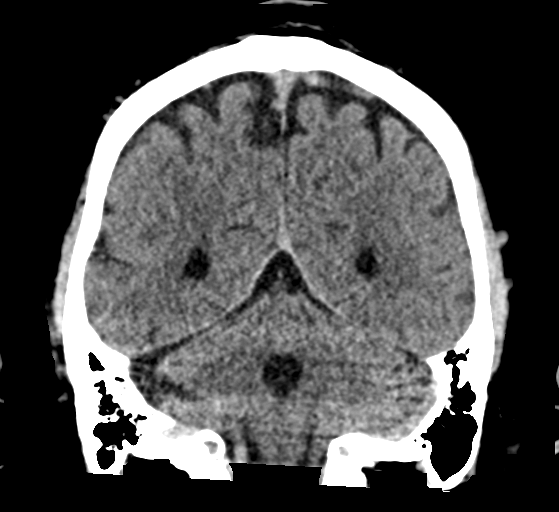
[im 29/65  brain]
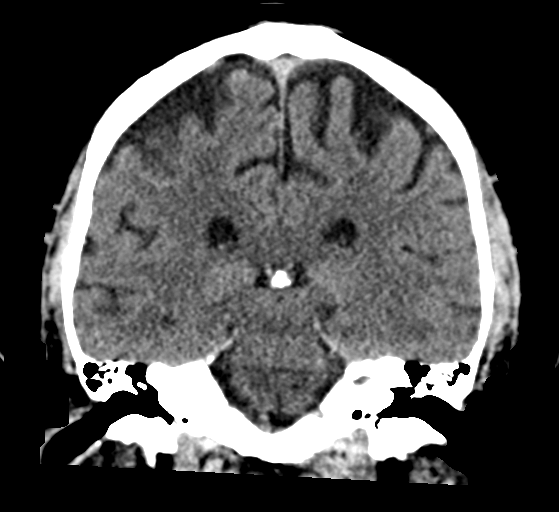
[im 36/65  brain]
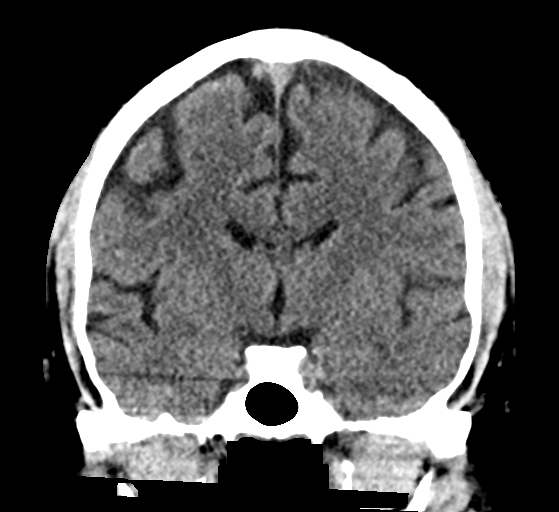

[Series 5: sagittal soft tissue · sagittal · 0.32mm/px · 3 of 57 slices shown]
[im 19/57  brain]
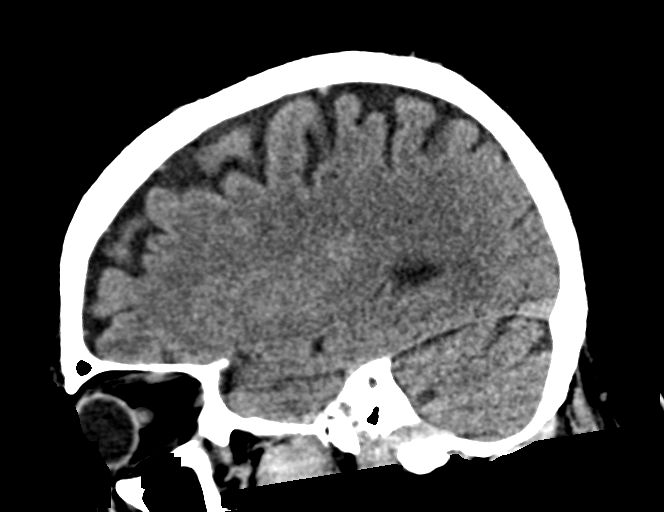
[im 29/57  brain]
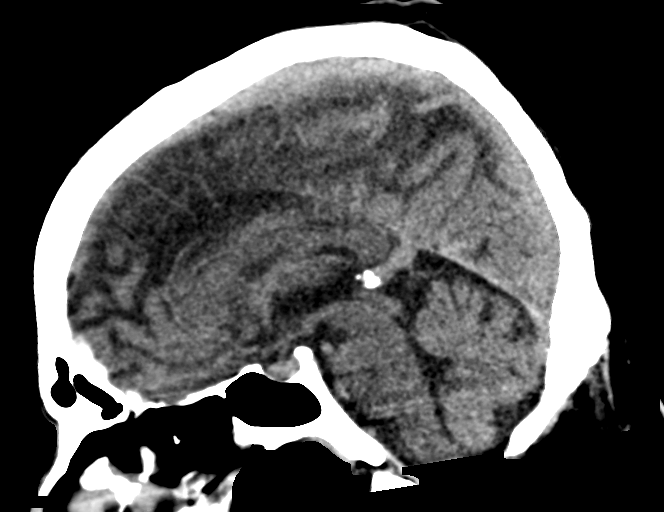
[im 38/57  brain]
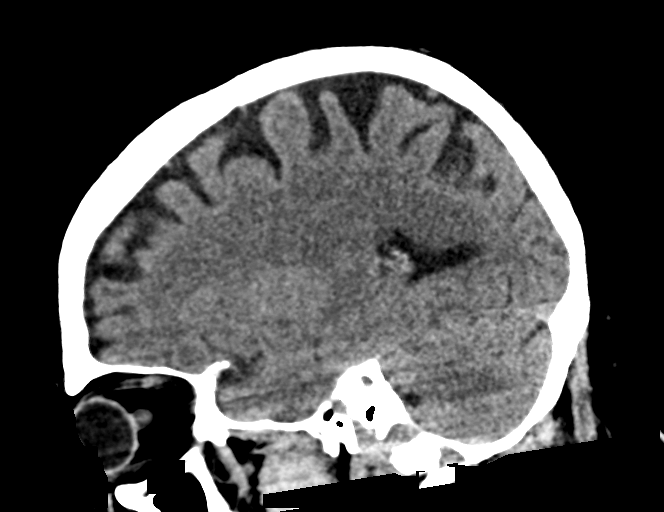

[17 of 47 positions shown; findings below may reference images not displayed]

FINDINGS: Brain: No evidence of acute infarction, hemorrhage, hydrocephalus,
extra-axial collection or mass lesion/mass effect.

Vascular: No hyperdense vessel or unexpected calcification.

Skull: Normal. Negative for fracture or focal lesion.

Sinuses/Orbits: No acute finding.

Other: None.
IMPRESSION: No acute intracranial abnormality seen.

## 2021-09-22 MED ORDER — GADOBUTROL 1 MMOL/ML IV SOLN
10.0000 mL | Freq: Once | INTRAVENOUS | Status: AC | PRN
  Administered 2021-09-22: 10 mL via INTRAVENOUS

## 2021-09-22 NOTE — Consult Note (Signed)
Neurology Consultation Reason for Consult: Worsening diplopia Requesting Physician: Harvest Dark  CC: Double vision  History is obtained from: Patient, daughter at bedside, and chart review  HPI: Preston Bryan is a 55 y.o. male with a past medical history significant for recently diagnosed hepatocellular carcinoma in the setting of Karlene Lineman versus alcoholic cirrhosis, coronary artery disease s/p MI, diabetes, hypertension, hyperlipidemia, obstructive sleep apnea on CPAP, chronic lymphedema, thrombocytopenia.  He reports that on Sunday 11/20 he began to notice some blurry/double vision as well as some left eye pain and left-sided headache.  The headache is described as constant pressure in the left side of his face and eye.  He denies any aggravating or alleviating factors, but reports it is not positional, not worse with Valsalva, not associated with any fevers, not associated with any worsening of his baseline nausea (attributed to his liver issues), though he has had some intermittent flushing of his face with the headache that would last a few minutes at a time.  He denies any recent travel, insect bites etc.  During examination he notes that the headache seems worse with eye movement.  Chronicity of left eye proptosis is unclear.  Interestingly it is not proptotic appearing on MRI obtained today, but does appear proptotic on examination.  Daughter reported to ED provider that his face looks no different than normal, but reported to me that the proptosis also began Sunday with the other acute complaints.  Patient additionally complains of some left arm tingling and numbness that he noticed while he was in the MRI scanner which is since resolved and involved his entire left hand.  He additionally reports he had 2 episodes of transient loss of vision (Sunday as well as on Monday, which has not since recurred).  He denies any pulsatile tinnitus or new hearing changes that he has noticed.  He notes that the  diplopia is worse in left gaze and does seem to wax and wane to some degree but there is no clear timing of this (not worse at any particular time of day).  He denies any loss of visual acuity or color vision but then reports that he had some reduced acuity of the vision in his left eye when he was tested with a MyChart one of his recent outpatient evaluations.  He reports no new medications recently, but has started some supplements from a friend (Information systems manager and [email or web address]; a vitamin blend and yerba mate extract respective which he takes TID w/ meals and every morning respectively)  ROS: All other review of systems was negative except as noted in the HPI.   No past medical history on file. See above as determined from patient and outside records   No family history on file.  Social History:  has no history on file for tobacco use, alcohol use, and drug use.  Exam: Current vital signs: BP (!) 146/80   Pulse 67   Temp 98.1 F (36.7 C) (Oral)   Resp 19   Ht 6\' 2"  (1.88 m)   Wt (!) 165.1 kg   SpO2 95%   BMI 46.73 kg/m  Vital signs in last 24 hours: Temp:  [98.1 F (36.7 C)] 98.1 F (36.7 C) (11/25 0940) Pulse Rate:  [67-68] 67 (11/25 1404) Resp:  [18-19] 19 (11/25 1404) BP: (146-150)/(80-86) 146/80 (11/25 1404) SpO2:  [95 %] 95 % (11/25 1404) Weight:  [165.1 kg] 165.1 kg (11/25 0941)   Physical Exam  Constitutional: Appears well-developed and well-nourished, obese  Psych: Affect appropriate to situation, calm and cooperative Eyes: No scleral injection, some proptosis of the left eye  HENT: No oropharyngeal obstruction.  MSK: no joint deformities. Lymphedema wraps in place in bilateral LE.  Cardiovascular: Perfusing extremities well  Respiratory: Effort normal, non-labored breathing GI: Soft.  No distension. There is no tenderness.  Skin: Warm dry and intact visible skin.   Neuro: Mental Status: Patient is awake, alert, oriented  to person, place, month, year, and situation. Patient is able to give a clear and coherent history. No signs of aphasia or neglect Cranial Nerves: II: Visual Fields are full. Pupils are equal, round, and reactive to light. No APD. Fundoscopic exam reveals slight blurring of the nasal margin on the left. Normal right eye fundoscopic exam. With encouragement, acuity 20/25 on the LEFT and 20/45 on the RIGHT (patient expresses surprise this is reversed form prior for him), no color desaturation III,IV, VI: EOM notable for restricted lateral gaze of the left eye, no nystagmus though notable binocular diplopia worse on left gaze, no ptosis  V: Facial sensation is symmetric to temperature VII: Facial movement is symmetric.  VIII: hearing is intact to voice but on Weber testing with 128 Hz tuning fork reports sound is louder in the right ear.  X: Uvula elevates symmetrically XI: Shoulder shrug is symmetric. XII: tongue is midline without atrophy or fasciculations.  Motor: Tone is normal. Bulk is normal. 5/5 strength was present in all four extremities. No pronator drift  Sensory: Sensation is symmetric to light touch in the arms and legs. Deep Tendon Reflexes: 2+ and symmetric in the biceps Cerebellar: FNF and HKS are intact bilaterally  I have reviewed labs in epic and the results pertinent to this consultation are:  Metabolic Panel:  Lab Results  Component Value Date   GLUCOSE 63 (L) 09/22/2021   NA 138 09/22/2021   K 3.3 (L) 09/22/2021   CL 107 09/22/2021   CO2 26 09/22/2021   BUN 13 09/22/2021   CREATININE 0.85 09/22/2021   GFRNONAA >60 09/22/2021   CALCIUM 8.5 (L) 09/22/2021   PROT 7.5 09/22/2021   ALBUMIN 3.4 (L) 09/22/2021   BILITOT 2.2 (H) 09/22/2021   ALKPHOS 135 (H) 09/22/2021   AST 71 (H) 09/22/2021   ALT 40 09/22/2021   ANIONGAP 5 09/22/2021    CBC: Recent Labs  Lab 09/22/21 1250  WBC 4.7  HGB 14.3  HCT 40.5  MCV 89.4  PLT 63*    Coagulation  Studies: Recent Labs    09/22/21 1411  LABPROT 15.8*  INR 1.3*    CSF RBC 5772 and 57 (tubes 1 and 4), 43 and 8 (tubes 1 and 4), protein 49 and Glucose 56  Lumbar puncture was performed by Andris Flurry, PA, and supervised directly by Lanae Crumbly. Laqueta Carina, MD. Informed consent was obtained from the patient prior to the procedure, including potential complications of headache, allergy, and pain. With the patient prone, the lower back was prepped with Betadine. 1% Lidocaine was used for local anesthesia. Lumbar puncture was performed at the L3-L4 level using a 6 inch 20 gauge needle with return of blood-tinged CSF. Opening pressure measurement was attempted, but due to low flow from the catheter, could not be accurately assessed although elevated intracranial pressure is essentially excluded. 7 ml of CSF were obtained for laboratory studies. The patient tolerated the procedure well and there were no apparent complications. IMPRESSION: Successful fluoroscopic guided lumbar puncture. Laboratory specimens submitted. No immediate complication.  I have personally reviewed  the images obtained:  MRI brain w/ and w/o negative for acute intracranial process. Discussed with neuroradiologist reading the scan, aneurysm and venous thrombosis are adequately excluded given the quality of the study.   Impression: New onset double vision and headache.  Imaging reassuring against malignancy.  Lumbar puncture reassuring against increased intracranial pressure.  Preliminary CSF results not concerning for infection and overall bland (mildly elevated protein and pleocytosis in the setting of traumatic tap).  Mild myasthenia gravis remains on the differential but given his clinical stability this can be followed up on an outpatient basis and will recommend discontinuation of vitamins for now.  Multiple sclerosis unlikely given imaging findings and age/gender.  Diabetic/metabolic microvascular nerve palsy is also  another potential etiology which can be safely followed up on an outpatient basis  Initial recommendations:  -Lumbar puncture with opening pressure and basic studies as documented below  Further recommendations: - Appreciate CSF collection by neuroradiology. Called lab to prioritize CSF cell counts in tubes 1 & 4, protein, glucose, CSF culture with gram stain and cytology as there is unlikely to be enough for flow studies and it is unlikely to be processed in time to be a meaningful sample given the flow lab will be closed by the time sample arrives at [facility].  - Close outpatient follow-up with PCP, ophthalmology, and neurology   Lesleigh Noe MD-PhD [facility] [phone] [facility] coverage for [facility] is from 8 AM to 4 AM in-house and 4 PM to 8 PM by telephone/video. 8 PM to 8 AM emergent questions or overnight urgent questions should be addressed to Teleneurology On-call or [facility] neurohospitalist; contact information can be found on AMION  Greater than 110 minutes were spent in care of this patient today, greater than 50% at bedside

## 2021-09-22 NOTE — Discharge Instructions (Signed)
I discussed your case it in detail with neurology.  Dr Curly Shores wishes you to stop the vitamins for now.  They may possibly be causing part of the problem.  She wishes you to follow-up very closely with ophthalmology and neurology.  I have given you the phone number for Dr. Wallace Going who is on-call for ophthalmology today.  I will give his office a call in the morning let him know that you were seen in the emergency department with double vision and had a number of tests which she should be able to ask excess.  Neurology wished you to follow-up with him in the outpatient neurology.  To outpatient neurologist in town or Dr. Manuella Ghazi and Dr. Melrose Nakayama.  I have also given you their information.  Please also call their office and let them know the same thing that you were seen in the emergency department for double vision and had a number of tests performed.  Neurology here in the hospital wanted you to have close follow-up with them.  They will arrange that for you.  Please return if anything worsens at all or if you have any other problems.

## 2021-09-22 NOTE — ED Notes (Signed)
Pt transported to IR 

## 2021-09-22 NOTE — ED Notes (Signed)
Pt states blurry vision that started since Sunday and has not resolved. Pt states he has liver cancer.

## 2021-09-22 NOTE — ED Triage Notes (Signed)
Pt reports recent diagnosis of liver ca, loss of vision on Sunday, vision came back. But left eye is bulging and having difficulty seeing, states that it not normal for him and his left eye keeps crossing

## 2021-09-22 NOTE — ED Provider Notes (Signed)
[facility] Emergency Department Provider Note  Time seen: 12:34 PM  I have reviewed the triage vital signs and the nursing notes.   HISTORY  Chief Complaint Eye Problem   HPI Preston Bryan is a 55 y.o. male with a past medical history of liver cancer (without metastatic disease) presents to the emergency department for cute onset of visual changes.  According to the patient on Sunday evening he acutely lost vision in both of his eyes.  He states this lasted approximately 3 to 4 seconds and then resolved.  He states the next day however he was driving when he noticed that his vision was blurred and doubled at times.  Patient states no history of visual issues previously.  Patient was recently diagnosed with liver cancer, is scheduled for follow-up/mapping procedure in approximately 1 week but states he was told there was no known metastatic disease.  Patient states he has had a mild left-sided headache over the past several days as well.  Vision remains blurred and doubled at times.   No past medical history on file.  There are no problems to display for this patient.   Prior to Admission medications   Not on File    No Known Allergies  No family history on file.  Social History    Review of Systems Constitutional: Negative for fever. Eyes: Blurred and double vision x5 days Cardiovascular: Negative for chest pain. Respiratory: Negative for shortness of breath. Gastrointestinal: Negative for abdominal pain, vomiting  Musculoskeletal: Negative for musculoskeletal complaints Neurological: Mild left-sided headache.  No weakness or numbness.  Blurred vision. All other ROS negative  ____________________________________________   PHYSICAL EXAM:  VITAL SIGNS: ED Triage Vitals  Enc Vitals Group     BP 09/22/21 0940 (!) 150/86     Pulse Rate 09/22/21 0940 68     Resp 09/22/21 0940 18     Temp 09/22/21 0940 98.1 F (36.7 C)     Temp Source 09/22/21  0940 Oral     SpO2 09/22/21 0940 95 %     Weight 09/22/21 0941 (!) 364 lb (165.1 kg)     Height 09/22/21 0941 6\' 2"  (1.88 m)     Head Circumference --      Peak Flow --      Pain Score 09/22/21 0941 6     Pain Loc --      Pain Edu? --      Excl. in [place]? --    Constitutional: Alert and oriented. Well appearing and in no distress. Eyes: Pupils equal and reactive, on extraocular testing patient appears to have a left 6th nerve palsy, unable to gaze laterally past the midline in the left eye only, reports double vision when attempting. ENT      Head: Normocephalic and atraumatic.      Mouth/Throat: Mucous membranes are moist. Cardiovascular: Normal rate, regular rhythm.  Respiratory: Normal respiratory effort without tachypnea nor retractions. Breath sounds are clear  Gastrointestinal: Soft and nontender. No distention.  Musculoskeletal: Nontender with normal range of motion in all extremities. Neurologic:  Normal speech and language. No gross focal neurologic deficits are appreciated besides 6th nerve palsy. Skin:  Skin is warm, dry and intact.  Psychiatric: Mood and affect are normal.  ____________________________________________   RADIOLOGY  CT scan head is negative  MRI negative for acute abnormality  ____________________________________________   INITIAL IMPRESSION / ASSESSMENT AND PLAN / ED COURSE  Pertinent labs & imaging results that were available during my care  of the patient were reviewed by me and considered in my medical decision making (see chart for details).   Patient presents to the emergency department with concerns of blurred/double vision.  Exam most consistent with a left 6th nerve palsy.  Given the patient's history of liver cancer this raises concern for possible metastatic disease.  CT scan head is negative, we will check labs and obtain an MRI of the brain with and without contrast.  Patient agreeable to plan of care.  Patient's MRI is negative for acute  abnormality.  I spoke to neurology Dr. Curly Shores.  She has concern for possible increased intracranial pressure leading to VI nerve palsy and intermittent visual loss/deficits.  She recommends therapeutic/diagnostic lumbar puncture.  I spoke to radiology who will be performing the lumbar puncture given his increased BMI and lower platelet count of 67.  I spoke to the patient regarding this he is agreeable to the plan.  Neurology states they will come see the patient as well.  Neurology has seen the patient.  I spoke to IR and they are going to do perform a lumbar puncture.  She will also be ordering CTA CTV.  Patient care signed out to Dr. Rip Harbour, pending results.  Preston Bryan was evaluated in Emergency Department on 09/22/2021 for the symptoms described in the history of present illness. He was evaluated in the context of the global COVID-19 pandemic, which necessitated consideration that the patient might be at risk for infection with the SARS-CoV-2 virus that causes COVID-19. Institutional protocols and algorithms that pertain to the evaluation of patients at risk for COVID-19 are in a state of rapid change based on information released by regulatory bodies including the CDC and federal and state organizations. These policies and algorithms were followed during the patient's care in the ED.  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

## 2021-09-22 NOTE — ED Triage Notes (Signed)
Pt at MRI

## 2021-09-25 LAB — PATHOLOGIST SMEAR REVIEW

## 2021-09-26 LAB — CSF CULTURE W GRAM STAIN
Culture: NO GROWTH
Gram Stain: NONE SEEN

## 2021-09-26 LAB — CYTOLOGY - NON PAP

## 2021-10-16 DIAGNOSIS — K644 Residual hemorrhoidal skin tags: Secondary | ICD-10-CM | POA: Insufficient documentation

## 2021-10-17 ENCOUNTER — Other Ambulatory Visit (HOSPITAL_COMMUNITY): Payer: Self-pay | Admitting: Physician Assistant

## 2021-10-17 ENCOUNTER — Other Ambulatory Visit: Payer: Self-pay | Admitting: Physician Assistant

## 2021-10-17 DIAGNOSIS — R42 Dizziness and giddiness: Secondary | ICD-10-CM

## 2021-10-17 DIAGNOSIS — R519 Headache, unspecified: Secondary | ICD-10-CM

## 2021-10-17 DIAGNOSIS — H539 Unspecified visual disturbance: Secondary | ICD-10-CM

## 2021-10-17 DIAGNOSIS — H538 Other visual disturbances: Secondary | ICD-10-CM

## 2021-10-17 DIAGNOSIS — H4922 Sixth [abducent] nerve palsy, left eye: Secondary | ICD-10-CM

## 2021-11-02 ENCOUNTER — Telehealth (INDEPENDENT_AMBULATORY_CARE_PROVIDER_SITE_OTHER): Payer: Self-pay

## 2021-11-02 NOTE — Telephone Encounter (Signed)
Called and left patient a message to sch NP appt. Let him know that I had some availability on  11/07/21 at 11am or 4 pm and schd him in the 11 am spot and to call back if the date and time didn't work for him

## 2021-11-07 ENCOUNTER — Other Ambulatory Visit: Payer: Self-pay

## 2021-11-07 ENCOUNTER — Encounter (INDEPENDENT_AMBULATORY_CARE_PROVIDER_SITE_OTHER): Payer: Self-pay | Admitting: Vascular Surgery

## 2021-11-07 ENCOUNTER — Ambulatory Visit (INDEPENDENT_AMBULATORY_CARE_PROVIDER_SITE_OTHER): Payer: Medicare Other | Admitting: Vascular Surgery

## 2021-11-07 VITALS — BP 130/74 | HR 75 | Resp 16 | Ht 74.0 in | Wt 355.4 lb

## 2021-11-07 DIAGNOSIS — R600 Localized edema: Secondary | ICD-10-CM

## 2021-11-07 DIAGNOSIS — E119 Type 2 diabetes mellitus without complications: Secondary | ICD-10-CM | POA: Diagnosis not present

## 2021-11-07 DIAGNOSIS — I890 Lymphedema, not elsewhere classified: Secondary | ICD-10-CM

## 2021-11-07 DIAGNOSIS — C220 Liver cell carcinoma: Secondary | ICD-10-CM

## 2021-11-07 DIAGNOSIS — M316 Other giant cell arteritis: Secondary | ICD-10-CM

## 2021-11-07 NOTE — H&P (View-Only) (Signed)
Patient ID: Preston Bryan, male   DOB: 05-13-1966, 56 y.o.   MRN: [id number]  Chief Complaint  Patient presents with   New Patient (Initial Visit)    Ref Melrose Nakayama temporary artery biopsy consult    HPI Preston Bryan is a 56 y.o. male.  I am asked to see the patient by Dr. Melrose Nakayama for evaluation for temporal artery biopsy.  The patient has been having incapacitating headaches on the left side.  This was associated with visual symptoms.  He had elevated inflammatory markers and there was significant concern for temporal arteritis as the cause. The patient also has a significant history of leg swelling with venous stasis as well as lymphedema.  He had laser ablations done when he lived in [place].  He says this was very painful.  He moved here about 2 years ago largely to deal with his liver cirrhosis which has now turned into liver cancer.  He still has some leg swelling, but the use of the lymphedema pump and the daily use of compression socks have largely reduce the swelling to a very mild amount that is manageable.   Past Medical History:  Diagnosis Date   Cancer ([place])    liver   Diabetes mellitus without complication ([place])    Hyperlipidemia    Hypertension    Myocardial infarction [facility])     Past Surgical History Laser ablation of GSVs   Family History  Problem Relation Age of Onset   Arthritis Mother    Diabetes Father   No bleeding or clotting disorders   Social History   Tobacco Use   Smoking status: Former    Types: Cigarettes   Smokeless tobacco: Never  Substance Use Topics   Alcohol use: Not Currently   Drug use: Never     No Known Allergies  Current Outpatient Medications  Medication Sig Dispense Refill   atorvastatin (LIPITOR) 20 MG tablet Take 20 mg by mouth daily.     febuxostat (ULORIC) 40 MG tablet Take 40 mg by mouth daily.     finasteride (PROSCAR) 5 MG tablet Take 5 mg by mouth daily.     furosemide (LASIX) 40 MG tablet Take 80 mg by mouth 2 (two)  times daily.     lactulose (CHRONULAC) 10 GM/15ML solution SMARTSIG:Milliliter(s) By Mouth     Oxycodone HCl 10 MG TABS Take 10 mg by mouth 4 (four) times daily as needed.     propranolol (INDERAL) 10 MG tablet Take 10 mg by mouth 3 (three) times daily.     sodium bicarbonate 650 MG tablet Take by mouth.     spironolactone (ALDACTONE) 100 MG tablet Take 100 mg by mouth daily.     Testosterone 12.5 MG/ACT (1%) GEL Apply topically.     traZODone (DESYREL) 50 MG tablet Take 50 mg by mouth at bedtime.     No current facility-administered medications for this visit.      REVIEW OF SYSTEMS (Negative unless checked)  Constitutional: [] Weight loss  [] Fever  [] Chills Cardiac: [] Chest pain   [] Chest pressure   [] Palpitations   [] Shortness of breath when laying flat   [] Shortness of breath at rest   [] Shortness of breath with exertion. Vascular:  [] Pain in legs with walking   [] Pain in legs at rest   [] Pain in legs when laying flat   [] Claudication   [] Pain in feet when walking  [] Pain in feet at rest  [] Pain in feet when laying flat   [] History of  DVT   [] Phlebitis   [x] Swelling in legs   [x] Varicose veins   [] Non-healing ulcers Pulmonary:   [] Uses home oxygen   [] Productive cough   [] Hemoptysis   [] Wheeze  [] COPD   [] Asthma Neurologic:  [] Dizziness  [] Blackouts   [] Seizures   [] History of stroke   [] History of TIA  [] Aphasia   [x] Temporary blindness   [] Dysphagia   [] Weakness or numbness in arms   [] Weakness or numbness in legs X positive for headaches Musculoskeletal:  [] Arthritis   [] Joint swelling   [] Joint pain   [] Low back pain Hematologic:  [] Easy bruising  [] Easy bleeding   [] Hypercoagulable state   [x] Anemic  [] Hepatitis Gastrointestinal:  [] Blood in stool   [] Vomiting blood  [] Gastroesophageal reflux/heartburn   [] Abdominal pain Genitourinary:  [] Chronic kidney disease   [] Difficult urination  [] Frequent urination  [] Burning with urination   [] Hematuria Skin:  [] Rashes   [] Ulcers    [] Wounds Psychological:  [] History of anxiety   []  History of major depression.    Physical Exam BP 130/74 (BP Location: Right Arm)    Pulse 75    Resp 16    Ht 6\' 2"  (1.88 m)    Wt (!) 355 lb 6.4 oz (161.2 kg)    BMI 45.63 kg/m  Gen:  WD/WN, NAD.  Obese Head: [place]/AT, No temporalis wasting. Ear/Nose/Throat: Hearing grossly intact, nares w/o erythema or drainage, oropharynx w/o Erythema/Exudate Eyes: Conjunctiva clear, sclera non-icteric.  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  No JVD.  Pulmonary:  Good air movement, respirations not labored, no use of accessory muscles  Cardiac: RRR, no JVD Vascular:  Vessel Right Left  Radial Palpable Palpable                                   Gastrointestinal:. No masses, surgical incisions, or scars. Musculoskeletal: M/S 5/5 throughout.  Extremities without ischemic changes.  No deformity or atrophy.  Moderate stasis dermatitis changes are present bilaterally.  1+ bilateral lower extremity edema. Neurologic: Sensation grossly intact in extremities.  Symmetrical.  Speech is fluent. Motor exam as listed above. Psychiatric: Judgment intact, Mood & affect appropriate for pt's clinical situation. Dermatologic: No rashes or ulcers noted.  No cellulitis or open wounds.    Radiology No results found.  Labs Recent Results (from the past 2160 hour(s))  CBC     Status: Abnormal   Collection Time: 09/22/21 12:50 PM  Result Value Ref Range   WBC 4.7 4.0 - 10.5 K/uL   RBC 4.53 4.22 - 5.81 MIL/uL   Hemoglobin 14.3 13.0 - 17.0 g/dL   HCT 40.5 39.0 - 52.0 %   MCV 89.4 80.0 - 100.0 fL   MCH 31.6 26.0 - 34.0 pg   MCHC 35.3 30.0 - 36.0 g/dL   RDW 13.2 11.5 - 15.5 %   Platelets 63 (L) 150 - 400 K/uL    Comment: Immature Platelet Fraction may be clinically indicated, consider ordering this additional test [id number]    nRBC 0.0 0.0 - 0.2 %    Comment: Performed at [facility], [address]  [place], [place] [address]  Comprehensive metabolic panel     Status: Abnormal   Collection Time: 09/22/21 12:50 PM  Result Value Ref Range   Sodium 138 135 - 145 mmol/L   Potassium 3.3 (L) 3.5 - 5.1 mmol/L   Chloride 107 98 - 111 mmol/L  CO2 26 22 - 32 mmol/L   Glucose, Bld 63 (L) 70 - 99 mg/dL    Comment: Glucose reference range applies only to samples taken after fasting for at least 8 hours.   BUN 13 6 - 20 mg/dL   Creatinine, Ser 0.85 0.61 - 1.24 mg/dL   Calcium 8.5 (L) 8.9 - 10.3 mg/dL   Total Protein 7.5 6.5 - 8.1 g/dL   Albumin 3.4 (L) 3.5 - 5.0 g/dL   AST 71 (H) 15 - 41 U/L   ALT 40 0 - 44 U/L   Alkaline Phosphatase 135 (H) 38 - 126 U/L   Total Bilirubin 2.2 (H) 0.3 - 1.2 mg/dL   GFR, Estimated >60 >60 mL/min    Comment: (NOTE) Calculated using the CKD-EPI Creatinine Equation (2021)    Anion gap 5 5 - 15    Comment: Performed at [facility], [address] [place], [place] [address]  TSH     Status: None   Collection Time: 09/22/21 12:50 PM  Result Value Ref Range   TSH 2.089 0.350 - 4.500 uIU/mL    Comment: Performed by a 3rd Generation assay with a functional sensitivity of <=0.01 uIU/mL. Performed at [facility], [address] [place], [place] [address]   Protime-INR     Status: Abnormal   Collection Time: 09/22/21  2:11 PM  Result Value Ref Range   Prothrombin Time 15.8 (H) 11.4 - 15.2 seconds   INR 1.3 (H) 0.8 - 1.2    Comment: (NOTE) INR goal varies based on device and disease states. Performed at [facility], [address] [place], [place] [address]   CSF cell count with differential collection tube #: 1     Status: Abnormal   Collection Time: 09/22/21  3:15 PM  Result Value Ref Range   Tube # 1    Color, CSF COLORLESS COLORLESS   Appearance, CSF CLEAR CLEAR   Supernatant NOT APPLICABLE    RBC Count, CSF 57 (H) 0 - 3 /cu mm   WBC, CSF 8 (H) 0 - 5 /cu mm   Segmented Neutrophils-CSF 29 %   Lymphs, CSF 57 %    Monocyte-Macrophage-Spinal Fluid 14 %   Eosinophils, CSF 0 %   Other Cells, CSF 0     Comment: Performed at [facility], [address] [place], [place] [address]  CSF cell count with differential collection tube #: 4     Status: Abnormal   Collection Time: 09/22/21  3:15 PM  Result Value Ref Range   Tube # 4    Color, CSF PINK (A) COLORLESS   Appearance, CSF HAZY (A) CLEAR   Supernatant NOT APPLICABLE    RBC Count, CSF 5,772 (H) 0 - 3 /cu mm   WBC, CSF 43 (HH) 0 - 5 /cu mm    Comment: CRITICAL RESULT CALLED TO, READ BACK BY AND VERIFIED WITH: WHITNEY HICKS 09/22/21 MU    Segmented Neutrophils-CSF 50 %   Lymphs, CSF 0 %   Monocyte-Macrophage-Spinal Fluid 50 %   Eosinophils, CSF 0 %   Other Cells, CSF 0     Comment: Performed at [facility], [address] [place], [place] [address]  Glucose, CSF     Status: None   Collection Time: 09/22/21  3:15 PM  Result Value Ref Range   Glucose, CSF 56 40 - 70 mg/dL    Comment: Performed at [facility], [address] [place],  [address]  Protein, CSF     Status: Abnormal   Collection Time: 09/22/21  3:15 PM  Result Value Ref Range   Total  Protein, CSF 49 (H) 15 - 45 mg/dL    Comment: Performed at [facility], [address] [place], [place] [address]  CSF culture w Gram Stain     Status: None   Collection Time: 09/22/21  3:15 PM   Specimen: PATH Cytology CSF; Cerebrospinal Fluid  Result Value Ref Range   Specimen Description      CSF Performed at [facility], [address] [place], [place] [address]    Special Requests      NONE Performed at [facility], [place], [place] [address]    Gram Stain      NO ORGANISMS SEEN WBC SEEN RED BLOOD CELLS RESULT CALLED TO, READ BACK BY AND VERIFIED WITH: WHITNEY HICKS AT 1812 ON 09/22/21 BY SS    Culture      NO GROWTH 3 DAYS Performed at [facility], [place] [address] [place], [place]  [address]    Report Status 09/26/2021 FINAL   Pathologist smear review     Status: None   Collection Time: 09/22/21  3:15 PM  Result Value Ref Range   Path Review Cytospin of cerebrospinal fluid is reviewed.     Comment: No malignancy identified. RBCs and few mixed inflammatory cells, suggestive of traumatic lumbar puncture and peripheral blood contamination. Approximately 43 WBCs per uL, with 22 neutrophils per uL. Reviewed by Kathi Simpers, M.D. Performed at [facility], [address] [place], [place] [address]   Cytology - Non PAP;     Status: None   Collection Time: 09/22/21  3:43 PM  Result Value Ref Range   CYTOLOGY - NON GYN      CYTOLOGY - NON PAP CASE: [id number] PATIENT: Joaquim Lai Non-Gynecological Cytology Report     Specimen Submitted: A. CSF  Clinical History: None provided      DIAGNOSIS: A. CEREBROSPINAL FLUID, LUMBAR PUNCTURE: - NEGATIVE; NO EVIDENCE OF MALIGNANCY. - RARE BLOOD ELEMENTS.   GROSS DESCRIPTION: A. Labeled: Received labeled with the patient's name and date of birth (per requisition CSF) Received: Fresh Collection time: 3:43 PM on 09/22/2021 Placed into formalin time: Not applicable Volume: Less than 1 mL Description of fluid and container in which it is received: Received in a clear plastic tube with a clear plastic lid is colorless transparent fluid. Cytospin slide(s) received: Yes, 1  Specimen material submitted for: Cell block and ThinPrep  The cell block material is fixed in formalin for 6 hours prior to processing.  RB 09/25/2021  Final Diagnosis performed by Betsy Pries, MD.   Electronically signed 09/26/2021 1:51:04PM The electroni c signature indicates that the named Attending Pathologist has evaluated the specimen Technical component performed at [facility], [address] [place], [place] [address] Lab: [phone] Dir: Rush Farmer, MD, MMM  Professional component performed at [facility], [facility], [address], [place], [place] [address] Lab: [phone] Dir: Kathi Simpers, MD     Assessment/Plan:  Lymphedema of lower extremity Well managed with compression socks, elevation, and the lymphedema pump.  Swelling is very mild at this point and well controlled.  Has had previous venous interventions in the past.  Bilateral leg edema Well-controlled  Type 2 diabetes mellitus without complication, without long-term current use of insulin (HCC) blood glucose control important in reducing the progression of atherosclerotic disease. Also,  involved in wound healing. On appropriate medications.   Hepatocellular carcinoma ([place]) Currently undergoing treatment.  Could worsen lower extremity swelling.  Temporal arteritis ([place]) The patient has symptoms and laboratory findings that are worrisome for temporal arteritis.  I discussed the only way to definitively diagnose this is with a temporal artery biopsy.  I discussed the procedure in detail.  I discussed the risks and benefits of the procedure.  Patient voices understanding and desires to have a temporal artery biopsy.  We will get this scheduled in the near future at his convenience.      Leotis Pain 11/07/2021, 2:40 PM   This note was created with Dragon medical transcription system.  Any errors from dictation are unintentional.

## 2021-11-07 NOTE — Assessment & Plan Note (Signed)
Well controlled 

## 2021-11-07 NOTE — Assessment & Plan Note (Signed)
Currently undergoing treatment.  Could worsen lower extremity swelling.

## 2021-11-07 NOTE — Assessment & Plan Note (Signed)
Well managed with compression socks, elevation, and the lymphedema pump.  Swelling is very mild at this point and well controlled.  Has had previous venous interventions in the past.

## 2021-11-07 NOTE — Patient Instructions (Signed)
Temporal Artery Biopsy Arteries are blood vessels that carry blood from the heart to the rest of the body. Temporal arteries are found on the side of the head and between the ears and eyes. During a temporal artery biopsy, a sample of the temporal artery is removed and examined under a microscope. This procedure may be done to see if you have a condition that causes your arteries to become swollen or inflamed (temporal arteritis or giant cell arteritis). Tell a health care provider about: Any allergies you have. All medicines you are taking, including vitamins, herbs, eye drops, creams, and over-the-counter medicines. Any problems you or family members have had with anesthetic medicines. Any blood disorders you have. Any surgeries you have had. Any medical conditions you have or have had. Whether you are pregnant or may be pregnant. What are the risks? Generally, this is a safe procedure. However, problems may occur, including: Bleeding. A collection of blood under the skin (hematoma). Infection. Nerve damage in the temples. This can cause numbness or make the muscles in your face weak. Scarring. On the scalp, hair may not grow around the scar. What happens before the procedure? You may have blood tests to make sure that your blood clots normally. Ask your health care provider about: Changing or stopping your regular medicines. This is especially important if you are taking diabetes medicines or blood thinners. Taking medicines such as aspirin and ibuprofen. These medicines can thin your blood. Do not take these medicines unless your health care provider tells you to take them. Taking over-the-counter medicines, vitamins, herbs, and supplements. Follow instructions from your health care provider about eating or drinking restrictions. Plan to have someone take you home from the hospital or clinic. If you will be going home right after the procedure, plan to have someone with you for 24  hours. Ask your health care provider: How your surgery site will be marked. What steps will be taken to help prevent infection. These may include: Removing hair at the surgery site. Washing skin with a germ-killing soap. Taking antibiotic medicine. What happens during the procedure? Small monitors will be put on your body. They will be used to check your heart, blood pressure, and oxygen level. An IV will be inserted into one of your veins. You will be given one or more of the following: A medicine to help you relax (sedative). A medicine to numb the area (local anesthetic). A tool called a Doppler may be used to find the artery. An incision will be made over the temporal artery. Two clamps will be placed on the artery. Then, a small piece of the artery between the clamps will be cut and removed. The remaining ends of the artery will be closed with stitches (sutures) to avoid bleeding. The clamps will then be removed. The incision will be closed with sutures. A bandage (dressing) may be placed on the incision. The artery piece that was taken out will be checked under a microscope. The procedure may vary among health care providers and hospitals. What happens after the procedure? Your blood pressure, heart rate, breathing rate, and blood oxygen level may be monitored until you leave the hospital or clinic. You may be given medicine for pain. Do not drive for 24 hours if you were given a sedative during your procedure. Summary During a temporal artery biopsy, a sample of the temporal artery is removed and examined under a microscope. This procedure may be done to see if you have a condition that  causes your arteries to become swollen or inflamed (temporal arteritis or giant cell arteritis). Before the procedure, follow instructions from your health care provider about changing or stopping your medicines and about any eating or drinking restrictions. Plan to have someone take you home after  the procedure and have someone with you for 24 hours. During the procedure, the incision will be closed with sutures, and a bandage (dressing) may be placed on the incision. This information is not intended to replace advice given to you by your health care provider. Make sure you discuss any questions you have with your health care provider. Document Revised: 12/27/2020 Document Reviewed: 12/27/2020 Elsevier Patient Education  [place].

## 2021-11-07 NOTE — Assessment & Plan Note (Signed)
The patient has symptoms and laboratory findings that are worrisome for temporal arteritis.  I discussed the only way to definitively diagnose this is with a temporal artery biopsy.  I discussed the procedure in detail.  I discussed the risks and benefits of the procedure.  Patient voices understanding and desires to have a temporal artery biopsy.  We will get this scheduled in the near future at his convenience.

## 2021-11-07 NOTE — Progress Notes (Signed)
Patient ID: Preston Bryan, male   DOB: 1965/12/12, 56 y.o.   MRN: [id number]  Chief Complaint  Patient presents with   New Patient (Initial Visit)    Ref Melrose Nakayama temporary artery biopsy consult    HPI Preston Bryan is a 56 y.o. male.  I am asked to see the patient by Dr. Melrose Nakayama for evaluation for temporal artery biopsy.  The patient has been having incapacitating headaches on the left side.  This was associated with visual symptoms.  He had elevated inflammatory markers and there was significant concern for temporal arteritis as the cause. The patient also has a significant history of leg swelling with venous stasis as well as lymphedema.  He had laser ablations done when he lived in [place].  He says this was very painful.  He moved here about 2 years ago largely to deal with his liver cirrhosis which has now turned into liver cancer.  He still has some leg swelling, but the use of the lymphedema pump and the daily use of compression socks have largely reduce the swelling to a very mild amount that is manageable.   Past Medical History:  Diagnosis Date   Cancer ([place])    liver   Diabetes mellitus without complication ([place])    Hyperlipidemia    Hypertension    Myocardial infarction [facility])     Past Surgical History Laser ablation of GSVs   Family History  Problem Relation Age of Onset   Arthritis Mother    Diabetes Father   No bleeding or clotting disorders   Social History   Tobacco Use   Smoking status: Former    Types: Cigarettes   Smokeless tobacco: Never  Substance Use Topics   Alcohol use: Not Currently   Drug use: Never     No Known Allergies  Current Outpatient Medications  Medication Sig Dispense Refill   atorvastatin (LIPITOR) 20 MG tablet Take 20 mg by mouth daily.     febuxostat (ULORIC) 40 MG tablet Take 40 mg by mouth daily.     finasteride (PROSCAR) 5 MG tablet Take 5 mg by mouth daily.     furosemide (LASIX) 40 MG tablet Take 80 mg by mouth 2 (two)  times daily.     lactulose (CHRONULAC) 10 GM/15ML solution SMARTSIG:Milliliter(s) By Mouth     Oxycodone HCl 10 MG TABS Take 10 mg by mouth 4 (four) times daily as needed.     propranolol (INDERAL) 10 MG tablet Take 10 mg by mouth 3 (three) times daily.     sodium bicarbonate 650 MG tablet Take by mouth.     spironolactone (ALDACTONE) 100 MG tablet Take 100 mg by mouth daily.     Testosterone 12.5 MG/ACT (1%) GEL Apply topically.     traZODone (DESYREL) 50 MG tablet Take 50 mg by mouth at bedtime.     No current facility-administered medications for this visit.      REVIEW OF SYSTEMS (Negative unless checked)  Constitutional: [] Weight loss  [] Fever  [] Chills Cardiac: [] Chest pain   [] Chest pressure   [] Palpitations   [] Shortness of breath when laying flat   [] Shortness of breath at rest   [] Shortness of breath with exertion. Vascular:  [] Pain in legs with walking   [] Pain in legs at rest   [] Pain in legs when laying flat   [] Claudication   [] Pain in feet when walking  [] Pain in feet at rest  [] Pain in feet when laying flat   [] History of  DVT   [] Phlebitis   [x] Swelling in legs   [x] Varicose veins   [] Non-healing ulcers Pulmonary:   [] Uses home oxygen   [] Productive cough   [] Hemoptysis   [] Wheeze  [] COPD   [] Asthma Neurologic:  [] Dizziness  [] Blackouts   [] Seizures   [] History of stroke   [] History of TIA  [] Aphasia   [x] Temporary blindness   [] Dysphagia   [] Weakness or numbness in arms   [] Weakness or numbness in legs X positive for headaches Musculoskeletal:  [] Arthritis   [] Joint swelling   [] Joint pain   [] Low back pain Hematologic:  [] Easy bruising  [] Easy bleeding   [] Hypercoagulable state   [x] Anemic  [] Hepatitis Gastrointestinal:  [] Blood in stool   [] Vomiting blood  [] Gastroesophageal reflux/heartburn   [] Abdominal pain Genitourinary:  [] Chronic kidney disease   [] Difficult urination  [] Frequent urination  [] Burning with urination   [] Hematuria Skin:  [] Rashes   [] Ulcers    [] Wounds Psychological:  [] History of anxiety   []  History of major depression.    Physical Exam BP 130/74 (BP Location: Right Arm)    Pulse 75    Resp 16    Ht 6\' 2"  (1.88 m)    Wt (!) 355 lb 6.4 oz (161.2 kg)    BMI 45.63 kg/m  Gen:  WD/WN, NAD.  Obese Head: [place]/AT, No temporalis wasting. Ear/Nose/Throat: Hearing grossly intact, nares w/o erythema or drainage, oropharynx w/o Erythema/Exudate Eyes: Conjunctiva clear, sclera non-icteric.  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  No JVD.  Pulmonary:  Good air movement, respirations not labored, no use of accessory muscles  Cardiac: RRR, no JVD Vascular:  Vessel Right Left  Radial Palpable Palpable                                   Gastrointestinal:. No masses, surgical incisions, or scars. Musculoskeletal: M/S 5/5 throughout.  Extremities without ischemic changes.  No deformity or atrophy.  Moderate stasis dermatitis changes are present bilaterally.  1+ bilateral lower extremity edema. Neurologic: Sensation grossly intact in extremities.  Symmetrical.  Speech is fluent. Motor exam as listed above. Psychiatric: Judgment intact, Mood & affect appropriate for pt's clinical situation. Dermatologic: No rashes or ulcers noted.  No cellulitis or open wounds.    Radiology No results found.  Labs Recent Results (from the past 2160 hour(s))  CBC     Status: Abnormal   Collection Time: 09/22/21 12:50 PM  Result Value Ref Range   WBC 4.7 4.0 - 10.5 K/uL   RBC 4.53 4.22 - 5.81 MIL/uL   Hemoglobin 14.3 13.0 - 17.0 g/dL   HCT 40.5 39.0 - 52.0 %   MCV 89.4 80.0 - 100.0 fL   MCH 31.6 26.0 - 34.0 pg   MCHC 35.3 30.0 - 36.0 g/dL   RDW 13.2 11.5 - 15.5 %   Platelets 63 (L) 150 - 400 K/uL    Comment: Immature Platelet Fraction may be clinically indicated, consider ordering this additional test [id number]    nRBC 0.0 0.0 - 0.2 %    Comment: Performed at [facility], [address]  [place], [place] [address]  Comprehensive metabolic panel     Status: Abnormal   Collection Time: 09/22/21 12:50 PM  Result Value Ref Range   Sodium 138 135 - 145 mmol/L   Potassium 3.3 (L) 3.5 - 5.1 mmol/L   Chloride 107 98 - 111 mmol/L  CO2 26 22 - 32 mmol/L   Glucose, Bld 63 (L) 70 - 99 mg/dL    Comment: Glucose reference range applies only to samples taken after fasting for at least 8 hours.   BUN 13 6 - 20 mg/dL   Creatinine, Ser 0.85 0.61 - 1.24 mg/dL   Calcium 8.5 (L) 8.9 - 10.3 mg/dL   Total Protein 7.5 6.5 - 8.1 g/dL   Albumin 3.4 (L) 3.5 - 5.0 g/dL   AST 71 (H) 15 - 41 U/L   ALT 40 0 - 44 U/L   Alkaline Phosphatase 135 (H) 38 - 126 U/L   Total Bilirubin 2.2 (H) 0.3 - 1.2 mg/dL   GFR, Estimated >60 >60 mL/min    Comment: (NOTE) Calculated using the CKD-EPI Creatinine Equation (2021)    Anion gap 5 5 - 15    Comment: Performed at [facility], [address] [place], [place] [address]  TSH     Status: None   Collection Time: 09/22/21 12:50 PM  Result Value Ref Range   TSH 2.089 0.350 - 4.500 uIU/mL    Comment: Performed by a 3rd Generation assay with a functional sensitivity of <=0.01 uIU/mL. Performed at [facility], [address] [place], [place] [address]   Protime-INR     Status: Abnormal   Collection Time: 09/22/21  2:11 PM  Result Value Ref Range   Prothrombin Time 15.8 (H) 11.4 - 15.2 seconds   INR 1.3 (H) 0.8 - 1.2    Comment: (NOTE) INR goal varies based on device and disease states. Performed at [facility], [address] [place], [place] [address]   CSF cell count with differential collection tube #: 1     Status: Abnormal   Collection Time: 09/22/21  3:15 PM  Result Value Ref Range   Tube # 1    Color, CSF COLORLESS COLORLESS   Appearance, CSF CLEAR CLEAR   Supernatant NOT APPLICABLE    RBC Count, CSF 57 (H) 0 - 3 /cu mm   WBC, CSF 8 (H) 0 - 5 /cu mm   Segmented Neutrophils-CSF 29 %   Lymphs, CSF 57 %    Monocyte-Macrophage-Spinal Fluid 14 %   Eosinophils, CSF 0 %   Other Cells, CSF 0     Comment: Performed at [facility], [address] [place], [place] [address]  CSF cell count with differential collection tube #: 4     Status: Abnormal   Collection Time: 09/22/21  3:15 PM  Result Value Ref Range   Tube # 4    Color, CSF PINK (A) COLORLESS   Appearance, CSF HAZY (A) CLEAR   Supernatant NOT APPLICABLE    RBC Count, CSF 5,772 (H) 0 - 3 /cu mm   WBC, CSF 43 (HH) 0 - 5 /cu mm    Comment: CRITICAL RESULT CALLED TO, READ BACK BY AND VERIFIED WITH: WHITNEY HICKS 09/22/21 MU    Segmented Neutrophils-CSF 50 %   Lymphs, CSF 0 %   Monocyte-Macrophage-Spinal Fluid 50 %   Eosinophils, CSF 0 %   Other Cells, CSF 0     Comment: Performed at [facility], [address] [place], [place] [address]  Glucose, CSF     Status: None   Collection Time: 09/22/21  3:15 PM  Result Value Ref Range   Glucose, CSF 56 40 - 70 mg/dL    Comment: Performed at [facility], [address], [place], [place] [address]  Protein, CSF     Status: Abnormal   Collection Time: 09/22/21  3:15 PM  Result Value Ref Range   Total  Protein, CSF 49 (H) 15 - 45 mg/dL    Comment: Performed at [facility], [address] [place], [place] [address]  CSF culture w Gram Stain     Status: None   Collection Time: 09/22/21  3:15 PM   Specimen: PATH Cytology CSF; Cerebrospinal Fluid  Result Value Ref Range   Specimen Description      CSF Performed at [facility], [address] [place], [place] [address]    Special Requests      NONE Performed at [facility], [place], [place] [address]    Gram Stain      NO ORGANISMS SEEN WBC SEEN RED BLOOD CELLS RESULT CALLED TO, READ BACK BY AND VERIFIED WITH: WHITNEY HICKS AT 1812 ON 09/22/21 BY SS    Culture      NO GROWTH 3 DAYS Performed at [facility], [address], [place], [place]  [address]    Report Status 09/26/2021 FINAL   Pathologist smear review     Status: None   Collection Time: 09/22/21  3:15 PM  Result Value Ref Range   Path Review Cytospin of cerebrospinal fluid is reviewed.     Comment: No malignancy identified. RBCs and few mixed inflammatory cells, suggestive of traumatic lumbar puncture and peripheral blood contamination. Approximately 43 WBCs per uL, with 22 neutrophils per uL. Reviewed by Kathi Simpers, M.D. Performed at [facility], [address] [place], [place] [address]   Cytology - Non PAP;     Status: None   Collection Time: 09/22/21  3:43 PM  Result Value Ref Range   CYTOLOGY - NON GYN      CYTOLOGY - NON PAP CASE: [id number] PATIENT: Joaquim Lai Non-Gynecological Cytology Report     Specimen Submitted: A. CSF  Clinical History: None provided      DIAGNOSIS: A. CEREBROSPINAL FLUID, LUMBAR PUNCTURE: - NEGATIVE; NO EVIDENCE OF MALIGNANCY. - RARE BLOOD ELEMENTS.   GROSS DESCRIPTION: A. Labeled: Received labeled with the patient's name and date of birth (per requisition CSF) Received: Fresh Collection time: 3:43 PM on 09/22/2021 Placed into formalin time: Not applicable Volume: Less than 1 mL Description of fluid and container in which it is received: Received in a clear plastic tube with a clear plastic lid is colorless transparent fluid. Cytospin slide(s) received: Yes, 1  Specimen material submitted for: Cell block and ThinPrep  The cell block material is fixed in formalin for 6 hours prior to processing.  RB 09/25/2021  Final Diagnosis performed by Betsy Pries, MD.   Electronically signed 09/26/2021 1:51:04PM The electroni c signature indicates that the named Attending Pathologist has evaluated the specimen Technical component performed at [facility], [address] [place], [place] [address] Lab: [phone] Dir: Rush Farmer, MD, MMM  Professional component performed at [facility], [facility], [address], [place], [place] [address] Lab: [phone] Dir: Kathi Simpers, MD     Assessment/Plan:  Lymphedema of lower extremity Well managed with compression socks, elevation, and the lymphedema pump.  Swelling is very mild at this point and well controlled.  Has had previous venous interventions in the past.  Bilateral leg edema Well-controlled  Type 2 diabetes mellitus without complication, without long-term current use of insulin (HCC) blood glucose control important in reducing the progression of atherosclerotic disease. Also,  involved in wound healing. On appropriate medications.   Hepatocellular carcinoma ([place]) Currently undergoing treatment.  Could worsen lower extremity swelling.  Temporal arteritis ([place]) The patient has symptoms and laboratory findings that are worrisome for temporal arteritis.  I discussed the only way to definitively diagnose this is with a temporal artery biopsy.  I discussed the procedure in detail.  I discussed the risks and benefits of the procedure.  Patient voices understanding and desires to have a temporal artery biopsy.  We will get this scheduled in the near future at his convenience.      Leotis Pain 11/07/2021, 2:40 PM   This note was created with Dragon medical transcription system.  Any errors from dictation are unintentional.

## 2021-11-07 NOTE — Assessment & Plan Note (Signed)
blood glucose control important in reducing the progression of atherosclerotic disease. Also, involved in wound healing. On appropriate medications.  

## 2021-11-16 ENCOUNTER — Telehealth (INDEPENDENT_AMBULATORY_CARE_PROVIDER_SITE_OTHER): Payer: Self-pay

## 2021-11-16 NOTE — Telephone Encounter (Signed)
Patient was called and is now scheduled with Dr. Lucky Cowboy for a temporal artery biopsy on 11/22/21 at the [facility]. Pre-op phone call will be on 11/17/21 between 8-1 pm. Pre-surgical instructions were discussed and will be mailed.

## 2021-11-17 ENCOUNTER — Other Ambulatory Visit: Payer: Self-pay

## 2021-11-17 ENCOUNTER — Encounter
Admission: RE | Admit: 2021-11-17 | Discharge: 2021-11-17 | Disposition: A | Discharge status: Home / Self Care | Payer: Medicare Other | Source: Ambulatory Visit | Attending: Vascular Surgery | Admitting: Vascular Surgery

## 2021-11-17 ENCOUNTER — Other Ambulatory Visit (INDEPENDENT_AMBULATORY_CARE_PROVIDER_SITE_OTHER): Payer: Self-pay | Admitting: Nurse Practitioner

## 2021-11-17 DIAGNOSIS — M316 Other giant cell arteritis: Secondary | ICD-10-CM

## 2021-11-17 HISTORY — DX: Angina pectoris, unspecified: I20.9

## 2021-11-17 HISTORY — DX: Sleep apnea, unspecified: G47.30

## 2021-11-17 HISTORY — DX: Unspecified cirrhosis of liver: K74.60

## 2021-11-17 HISTORY — DX: Thrombocytopenia, unspecified: D69.6

## 2021-11-17 NOTE — Patient Instructions (Signed)
Your procedure is scheduled on: 11/22/21 Report to [facility] [place]. To find out your arrival time please call [phone] between 1PM - 3PM on 11/21/21.  Remember: Instructions that are not followed completely may result in serious medical risk, up to and including death, or upon the discretion of your surgeon and anesthesiologist your surgery may need to be rescheduled.     _X__ 1. Do not eat food or drink any liquids after midnight the night before your procedure.                 No gum chewing or hard candies.   __X__2.  On the morning of surgery brush your teeth with toothpaste and water, you                 may rinse your mouth with mouthwash if you wish.  Do not swallow any              toothpaste of mouthwash.     _X__ 3.  No Alcohol for 24 hours before or after surgery.   _X__ 4.  Do Not Smoke or use e-cigarettes For 24 Hours Prior to Your Surgery.                 Do not use any chewable tobacco products for at least 6 hours prior to                 surgery.  ____  5.  Bring all medications with you on the day of surgery if instructed.   __X__  6.  Notify your doctor if there is any change in your medical condition      (cold, fever, infections).     Do not wear jewelry, make-up, hairpins, clips or nail polish. Do not wear lotions, powders, or perfumes.  Do not shave body hair 48 hours prior to surgery. Men may shave face and neck. Do not bring valuables to the hospital.    [facility] is not responsible for any belongings or valuables.  Contacts, dentures/partials or body piercings may not be worn into surgery. Bring a case for your contacts, glasses or hearing aids, a denture cup will be supplied. Leave your suitcase in the car. After surgery it may be brought to your room. For patients admitted to the hospital, discharge time is determined by your treatment team.   Patients discharged the day of surgery will not be  allowed to drive home.   Please read over the following fact sheets that you were given:     __X__ Take these medicines the morning of surgery with A SIP OF WATER:    1. atorvastatin (LIPITOR) 20 MG tablet  2. finasteride (PROSCAR) 5 MG tablet  3. propranolol (INDERAL) 10 MG tablet  4. Oxycodone HCl 10 MG TABS as needed  5.  6.  ____ Fleet Enema (as directed)   ____ Use CHG Soap/SAGE wipes as directed  ____ Use inhalers on the day of surgery  ____ Stop metformin/Janumet/Farxiga 2 days prior to surgery    ____ Take 1/2 of usual insulin dose the night before surgery. No insulin the morning          of surgery.   ____ Stop Blood Thinners Coumadin/Plavix/Xarelto/Pleta/Pradaxa/Eliquis/Effient/Aspirin  on   Or contact your Surgeon, Cardiologist or Medical Doctor regarding  ability to stop your blood thinners  __X__ Stop Anti-inflammatories 7 days before surgery such as Advil, Ibuprofen, Motrin,  BC  or Goodies Powder, Naprosyn, Naproxen, Aleve, Aspirin    __X__ Stop all herbals and supplements, fish oil or vitamins  until after surgery.    ____ Bring C-Pap to the hospital.

## 2021-11-22 ENCOUNTER — Ambulatory Visit: Payer: Medicare Other | Admitting: Anesthesiology

## 2021-11-22 ENCOUNTER — Encounter: Payer: Self-pay | Admitting: Vascular Surgery

## 2021-11-22 ENCOUNTER — Encounter
Admission: RE | Disposition: A | Discharge status: Home / Self Care | Payer: Self-pay | Source: Home / Self Care | Attending: Vascular Surgery

## 2021-11-22 ENCOUNTER — Other Ambulatory Visit: Payer: Self-pay

## 2021-11-22 ENCOUNTER — Ambulatory Visit
Admission: RE | Admit: 2021-11-22 | Discharge: 2021-11-22 | Disposition: A | Discharge status: Home / Self Care | Payer: Medicare Other | Attending: Vascular Surgery | Admitting: Vascular Surgery

## 2021-11-22 DIAGNOSIS — I2510 Atherosclerotic heart disease of native coronary artery without angina pectoris: Secondary | ICD-10-CM | POA: Diagnosis not present

## 2021-11-22 DIAGNOSIS — I252 Old myocardial infarction: Secondary | ICD-10-CM | POA: Insufficient documentation

## 2021-11-22 DIAGNOSIS — D696 Thrombocytopenia, unspecified: Secondary | ICD-10-CM | POA: Insufficient documentation

## 2021-11-22 DIAGNOSIS — C228 Malignant neoplasm of liver, primary, unspecified as to type: Secondary | ICD-10-CM | POA: Insufficient documentation

## 2021-11-22 DIAGNOSIS — M316 Other giant cell arteritis: Secondary | ICD-10-CM

## 2021-11-22 DIAGNOSIS — I878 Other specified disorders of veins: Secondary | ICD-10-CM | POA: Insufficient documentation

## 2021-11-22 DIAGNOSIS — I7789 Other specified disorders of arteries and arterioles: Secondary | ICD-10-CM | POA: Insufficient documentation

## 2021-11-22 DIAGNOSIS — I10 Essential (primary) hypertension: Secondary | ICD-10-CM | POA: Insufficient documentation

## 2021-11-22 DIAGNOSIS — Z6841 Body Mass Index (BMI) 40.0 and over, adult: Secondary | ICD-10-CM | POA: Insufficient documentation

## 2021-11-22 DIAGNOSIS — I890 Lymphedema, not elsewhere classified: Secondary | ICD-10-CM | POA: Insufficient documentation

## 2021-11-22 DIAGNOSIS — H4920 Sixth [abducent] nerve palsy, unspecified eye: Secondary | ICD-10-CM | POA: Insufficient documentation

## 2021-11-22 DIAGNOSIS — K7460 Unspecified cirrhosis of liver: Secondary | ICD-10-CM | POA: Insufficient documentation

## 2021-11-22 DIAGNOSIS — E119 Type 2 diabetes mellitus without complications: Secondary | ICD-10-CM | POA: Diagnosis not present

## 2021-11-22 DIAGNOSIS — M7989 Other specified soft tissue disorders: Secondary | ICD-10-CM | POA: Diagnosis not present

## 2021-11-22 DIAGNOSIS — Z87891 Personal history of nicotine dependence: Secondary | ICD-10-CM | POA: Diagnosis not present

## 2021-11-22 DIAGNOSIS — R519 Headache, unspecified: Secondary | ICD-10-CM | POA: Insufficient documentation

## 2021-11-22 DIAGNOSIS — G7000 Myasthenia gravis without (acute) exacerbation: Secondary | ICD-10-CM | POA: Insufficient documentation

## 2021-11-22 HISTORY — PX: ARTERY BIOPSY: SHX891

## 2021-11-22 LAB — GLUCOSE, CAPILLARY
Glucose-Capillary: 124 mg/dL — ABNORMAL HIGH (ref 70–99)
Glucose-Capillary: 98 mg/dL (ref 70–99)

## 2021-11-22 LAB — TYPE AND SCREEN
ABO/RH(D): O POS
Antibody Screen: NEGATIVE

## 2021-11-22 LAB — ABO/RH: ABO/RH(D): O POS

## 2021-11-22 SURGERY — BIOPSY TEMPORAL ARTERY
Anesthesia: General | Laterality: Left

## 2021-11-22 MED ORDER — ACETAMINOPHEN 10 MG/ML IV SOLN
INTRAVENOUS | Status: AC
  Filled 2021-11-22: qty 100

## 2021-11-22 MED ORDER — ORAL CARE MOUTH RINSE: 15.0000 mL | Freq: Once | OROMUCOSAL | Status: AC

## 2021-11-22 MED ORDER — FENTANYL CITRATE (PF) 100 MCG/2ML IJ SOLN
INTRAMUSCULAR | Status: AC
  Filled 2021-11-22: qty 2

## 2021-11-22 MED ORDER — 0.9 % SODIUM CHLORIDE (POUR BTL) OPTIME
TOPICAL | Status: DC | PRN
  Administered 2021-11-22: 12:00:00 100 mL

## 2021-11-22 MED ORDER — MIDAZOLAM HCL 2 MG/2ML IJ SOLN
INTRAMUSCULAR | Status: DC | PRN
  Administered 2021-11-22: 2 mg via INTRAVENOUS

## 2021-11-22 MED ORDER — DEXMEDETOMIDINE (PRECEDEX) IN NS 20 MCG/5ML (4 MCG/ML) IV SYRINGE
PREFILLED_SYRINGE | INTRAVENOUS | Status: DC | PRN
  Administered 2021-11-22: 20 ug via INTRAVENOUS

## 2021-11-22 MED ORDER — DEXMEDETOMIDINE (PRECEDEX) IN NS 20 MCG/5ML (4 MCG/ML) IV SYRINGE
PREFILLED_SYRINGE | INTRAVENOUS | Status: AC
  Filled 2021-11-22: qty 5

## 2021-11-22 MED ORDER — CHLORHEXIDINE GLUCONATE CLOTH 2 % EX PADS: 6.0000 | MEDICATED_PAD | Freq: Once | CUTANEOUS | Status: DC

## 2021-11-22 MED ORDER — PROPOFOL 10 MG/ML IV BOLUS
INTRAVENOUS | Status: DC | PRN
  Administered 2021-11-22: 300 mg via INTRAVENOUS

## 2021-11-22 MED ORDER — CEFAZOLIN SODIUM-DEXTROSE 2-4 GM/100ML-% IV SOLN
INTRAVENOUS | Status: AC
  Filled 2021-11-22: qty 100

## 2021-11-22 MED ORDER — LIDOCAINE HCL (PF) 1 % IJ SOLN
INTRAMUSCULAR | Status: AC
  Filled 2021-11-22: qty 30

## 2021-11-22 MED ORDER — LACTATED RINGERS IV SOLN: INTRAVENOUS | Status: DC

## 2021-11-22 MED ORDER — PROPOFOL 10 MG/ML IV BOLUS
INTRAVENOUS | Status: AC
  Filled 2021-11-22: qty 40

## 2021-11-22 MED ORDER — GLYCOPYRROLATE 0.2 MG/ML IJ SOLN
INTRAMUSCULAR | Status: AC
  Filled 2021-11-22: qty 1

## 2021-11-22 MED ORDER — ACETAMINOPHEN 10 MG/ML IV SOLN: 1000.0000 mg | Freq: Once | INTRAVENOUS | Status: DC | PRN

## 2021-11-22 MED ORDER — FAMOTIDINE 20 MG PO TABS
20.0000 mg | ORAL_TABLET | Freq: Once | ORAL | Status: AC
  Administered 2021-11-22: 10:00:00 20 mg via ORAL

## 2021-11-22 MED ORDER — ONDANSETRON HCL 4 MG/2ML IJ SOLN: 4.0000 mg | Freq: Four times a day (QID) | INTRAMUSCULAR | Status: DC | PRN

## 2021-11-22 MED ORDER — ACETAMINOPHEN 10 MG/ML IV SOLN
INTRAVENOUS | Status: DC | PRN
  Administered 2021-11-22: 1000 mg via INTRAVENOUS

## 2021-11-22 MED ORDER — LIDOCAINE HCL (PF) 1 % IJ SOLN
INTRAMUSCULAR | Status: DC | PRN
  Administered 2021-11-22: 4 mL

## 2021-11-22 MED ORDER — FENTANYL CITRATE (PF) 100 MCG/2ML IJ SOLN
INTRAMUSCULAR | Status: DC | PRN
  Administered 2021-11-22: 25 ug via INTRAVENOUS

## 2021-11-22 MED ORDER — FENTANYL CITRATE (PF) 100 MCG/2ML IJ SOLN: 25.0000 ug | INTRAMUSCULAR | Status: DC | PRN

## 2021-11-22 MED ORDER — SODIUM CHLORIDE (PF) 0.9 % IJ SOLN
INTRAMUSCULAR | Status: AC
  Filled 2021-11-22: qty 10

## 2021-11-22 MED ORDER — PHENYLEPHRINE 40 MCG/ML (10ML) SYRINGE FOR IV PUSH (FOR BLOOD PRESSURE SUPPORT)
PREFILLED_SYRINGE | INTRAVENOUS | Status: DC | PRN
  Administered 2021-11-22 (×5): 160 ug via INTRAVENOUS

## 2021-11-22 MED ORDER — LIDOCAINE HCL (PF) 2 % IJ SOLN
INTRAMUSCULAR | Status: AC
  Filled 2021-11-22: qty 5

## 2021-11-22 MED ORDER — CHLORHEXIDINE GLUCONATE 0.12 % MT SOLN: 15.0000 mL | Freq: Once | OROMUCOSAL | Status: AC

## 2021-11-22 MED ORDER — FAMOTIDINE 20 MG PO TABS
ORAL_TABLET | ORAL | Status: AC
  Filled 2021-11-22: qty 1

## 2021-11-22 MED ORDER — CEFAZOLIN SODIUM-DEXTROSE 2-4 GM/100ML-% IV SOLN
2.0000 g | INTRAVENOUS | Status: AC
  Administered 2021-11-22: 11:00:00 2 g via INTRAVENOUS
  Administered 2021-11-22: 12:00:00 1 g via INTRAVENOUS

## 2021-11-22 MED ORDER — LIDOCAINE HCL (CARDIAC) PF 100 MG/5ML IV SOSY
PREFILLED_SYRINGE | INTRAVENOUS | Status: DC | PRN
Start: 2021-11-22 — End: 2021-11-22
  Administered 2021-11-22: 100 mg via INTRAVENOUS

## 2021-11-22 MED ORDER — ONDANSETRON HCL 4 MG/2ML IJ SOLN
INTRAMUSCULAR | Status: AC
  Filled 2021-11-22: qty 2

## 2021-11-22 MED ORDER — DEXAMETHASONE SODIUM PHOSPHATE 10 MG/ML IJ SOLN
INTRAMUSCULAR | Status: AC
  Filled 2021-11-22: qty 1

## 2021-11-22 MED ORDER — CHLORHEXIDINE GLUCONATE 0.12 % MT SOLN
OROMUCOSAL | Status: AC
  Administered 2021-11-22: 10:00:00 15 mL via OROMUCOSAL
  Filled 2021-11-22: qty 15

## 2021-11-22 MED ORDER — MIDAZOLAM HCL 2 MG/2ML IJ SOLN
INTRAMUSCULAR | Status: AC
  Filled 2021-11-22: qty 2

## 2021-11-22 MED ORDER — OXYCODONE HCL 5 MG PO TABS
5.0000 mg | ORAL_TABLET | Freq: Once | ORAL | Status: AC | PRN
  Administered 2021-11-22: 13:00:00 5 mg via ORAL

## 2021-11-22 MED ORDER — ONDANSETRON HCL 4 MG/2ML IJ SOLN
INTRAMUSCULAR | Status: DC | PRN
  Administered 2021-11-22: 4 mg via INTRAVENOUS

## 2021-11-22 MED ORDER — SODIUM CHLORIDE 0.9 % IV SOLN: INTRAVENOUS | Status: DC

## 2021-11-22 MED ORDER — DEXAMETHASONE SODIUM PHOSPHATE 10 MG/ML IJ SOLN
INTRAMUSCULAR | Status: DC | PRN
  Administered 2021-11-22: 5 mg via INTRAVENOUS

## 2021-11-22 MED ORDER — OXYCODONE HCL 5 MG PO TABS
ORAL_TABLET | ORAL | Status: AC
  Filled 2021-11-22: qty 1

## 2021-11-22 MED ORDER — ONDANSETRON HCL 4 MG/2ML IJ SOLN: 4.0000 mg | Freq: Once | INTRAMUSCULAR | Status: DC | PRN

## 2021-11-22 MED ORDER — OXYCODONE HCL 5 MG/5ML PO SOLN: 5.0000 mg | Freq: Once | ORAL | Status: AC | PRN

## 2021-11-22 MED ORDER — GLYCOPYRROLATE 0.2 MG/ML IJ SOLN
INTRAMUSCULAR | Status: DC | PRN
  Administered 2021-11-22: .2 mg via INTRAVENOUS

## 2021-11-22 MED ORDER — CEFAZOLIN SODIUM 1 G IJ SOLR
INTRAMUSCULAR | Status: AC
  Filled 2021-11-22: qty 10

## 2021-11-22 MED ORDER — HYDROMORPHONE HCL 1 MG/ML IJ SOLN: 1.0000 mg | Freq: Once | INTRAMUSCULAR | Status: DC | PRN

## 2021-11-22 SURGICAL SUPPLY — 43 items
BLADE SURG 15 STRL LF DISP TIS (BLADE) ×1 IMPLANT
BLADE SURG 15 STRL SS (BLADE) ×1
BLADE SURG SZ11 CARB STEEL (BLADE) ×2 IMPLANT
CNTNR SPEC 2.5X3XGRAD LEK (MISCELLANEOUS) ×1
CONT SPEC 4OZ STER OR WHT (MISCELLANEOUS) ×1
CONTAINER SPEC 2.5X3XGRAD LEK (MISCELLANEOUS) IMPLANT
COTTON BALL STRL MEDIUM (GAUZE/BANDAGES/DRESSINGS) ×2 IMPLANT
DERMABOND ADVANCED (GAUZE/BANDAGES/DRESSINGS) ×1
DERMABOND ADVANCED .7 DNX12 (GAUZE/BANDAGES/DRESSINGS) ×1 IMPLANT
DRAPE LAPAROTOMY 77X122 PED (DRAPES) ×2 IMPLANT
DRSG TELFA 4X3 1S NADH ST (GAUZE/BANDAGES/DRESSINGS) ×2 IMPLANT
ELECT CAUTERY BLADE 6.4 (BLADE) ×2 IMPLANT
ELECT REM PT RETURN 9FT ADLT (ELECTROSURGICAL) ×2
ELECTRODE REM PT RTRN 9FT ADLT (ELECTROSURGICAL) ×1 IMPLANT
GAUZE 4X4 16PLY ~~LOC~~+RFID DBL (SPONGE) ×2 IMPLANT
GLOVE SURG SYN 7.0 (GLOVE) ×2 IMPLANT
GLOVE SURG SYN 7.0 PF PI (GLOVE) ×1 IMPLANT
GOWN STRL REUS W/ TWL LRG LVL3 (GOWN DISPOSABLE) ×1 IMPLANT
GOWN STRL REUS W/ TWL XL LVL3 (GOWN DISPOSABLE) ×1 IMPLANT
GOWN STRL REUS W/TWL LRG LVL3 (GOWN DISPOSABLE) ×1
GOWN STRL REUS W/TWL XL LVL3 (GOWN DISPOSABLE) ×1
KIT TURNOVER KIT A (KITS) ×2 IMPLANT
LABEL OR SOLS (LABEL) ×2 IMPLANT
MANIFOLD NEPTUNE II (INSTRUMENTS) ×2 IMPLANT
NDL HYPO 25X1 1.5 SAFETY (NEEDLE) IMPLANT
NEEDLE HYPO 25X1 1.5 SAFETY (NEEDLE) ×2 IMPLANT
NS IRRIG 500ML POUR BTL (IV SOLUTION) ×2 IMPLANT
PACK BASIN MINOR ARMC (MISCELLANEOUS) ×2 IMPLANT
SOL PREP PVP 2OZ (MISCELLANEOUS) ×2
SOLUTION PREP PVP 2OZ (MISCELLANEOUS) ×1 IMPLANT
SUCTION FRAZIER HANDLE 10FR (MISCELLANEOUS) ×1
SUCTION TUBE FRAZIER 10FR DISP (MISCELLANEOUS) ×1 IMPLANT
SUT MNCRL AB 4-0 PS2 18 (SUTURE) ×2 IMPLANT
SUT SILK 2 0 (SUTURE) ×1
SUT SILK 2-0 18XBRD TIE 12 (SUTURE) ×1 IMPLANT
SUT SILK 3 0 (SUTURE) ×1
SUT SILK 3-0 18XBRD TIE 12 (SUTURE) ×1 IMPLANT
SUT SILK 4 0 (SUTURE) ×1
SUT SILK 4-0 18XBRD TIE 12 (SUTURE) ×1 IMPLANT
SUT VIC AB 3-0 SH 27 (SUTURE) ×1
SUT VIC AB 3-0 SH 27X BRD (SUTURE) ×1 IMPLANT
SYR BULB IRRIG 60ML STRL (SYRINGE) ×2 IMPLANT
WATER STERILE IRR 500ML POUR (IV SOLUTION) ×2 IMPLANT

## 2021-11-22 NOTE — Transfer of Care (Signed)
Immediate Anesthesia Transfer of Care Note  Patient: Preston Bryan  Procedure(s) Performed: Procedure(s): BIOPSY TEMPORAL ARTERY (Left)  Patient Location: PACU  Anesthesia Type:General  Level of Consciousness: sedated  Airway & Oxygen Therapy: Patient Spontanous Breathing and Patient connected to face mask oxygen  Post-op Assessment: Report given to RN and Post -op Vital signs reviewed and stable  Post vital signs: Reviewed and stable  Last Vitals:  Vitals:   11/22/21 1006 11/22/21 1215  BP: (!) 146/79 (!) 99/46  Pulse: 67 66  Resp: 16 (!) 24  Temp: 36.7 C (!) 36.3 C  SpO2: 409% 92%    Complications: No apparent anesthesia complications

## 2021-11-22 NOTE — Discharge Instructions (Signed)
AMBULATORY SURGERY  ?DISCHARGE INSTRUCTIONS ? ? ?The drugs that you were given will stay in your system until tomorrow so for the next 24 hours you should not: ? ?Drive an automobile ?Make any legal decisions ?Drink any alcoholic beverage ? ? ?You may resume regular meals tomorrow.  Today it is better to start with liquids and gradually work up to solid foods. ? ?You may eat anything you prefer, but it is better to start with liquids, then soup and crackers, and gradually work up to solid foods. ? ? ?Please notify your doctor immediately if you have any unusual bleeding, trouble breathing, redness and pain at the surgery site, drainage, fever, or pain not relieved by medication. ? ? ? ?Additional Instructions: ? ? ? ?Please contact your physician with any problems or Same Day Surgery at [phone], Monday through Friday 6 am to 4 pm, or [place] at [facility] Main number at [phone].  ?

## 2021-11-22 NOTE — Interval H&P Note (Signed)
History and Physical Interval Note:  11/22/2021 9:18 AM  Preston Bryan  has presented today for surgery, with the diagnosis of TEMPORAL ARTERITIS.  The various methods of treatment have been discussed with the patient and family. After consideration of risks, benefits and other options for treatment, the patient has consented to  Procedure(s): BIOPSY TEMPORAL ARTERY (Left) as a surgical intervention.  The patient's history has been reviewed, patient examined, no change in status, stable for surgery.  I have reviewed the patient's chart and labs.  Questions were answered to the patient's satisfaction.     Leotis Pain

## 2021-11-22 NOTE — Op Note (Signed)
°        OPERATIVE NOTE   PRE-OPERATIVE DIAGNOSIS: suspected temporal arteritis, headaches  POST-OPERATIVE DIAGNOSIS: Same as above  PROCEDURE: 1.   Left temporal artery biopsy  SURGEON: Leotis Pain, MD  ASSISTANT(S): none  ANESTHESIA: general  ESTIMATED BLOOD LOSS: Minimal  FINDING(S): 1.  none  SPECIMEN(S):  Left superficial temporal artery sent to pathology  INDICATIONS:   Patient is a 57 y.o. male who presents with headaches and concern for temporal arteritis.  We were consulted by his neurologist for consideration for temporal artery biopsy. Risks and benefits were discussed and he was agreeable to proceed.  DESCRIPTION: After obtaining full informed written consent, the patient was brought back to the operating room and placed supine upon the operating table.  The patient received IV antibiotics prior to induction.  After obtaining adequate anesthesia, the patient was prepped and draped in the standard fashion. The area in front of his left ear was anesthetized copiously with a solution of 1% lidocaine and half percent Marcaine without epinephrine. I then made an incision just in front of the left ear overlying the palpable pulse. I then dissected down through the subcutaneous tissues and identified the superficial temporal artery. This was dissected out over a several centimeters and branches were ligated and divided between silk ties. Care was used to avoid electrocautery around the artery. I then clamped the artery proximally and distally and transected the artery. The specimen was then sent to pathology. The proximal and distal artery were ligated with 3-0 silk ties. Hemostasis was achieved. The wound was then closed with a series of interrupted 3-0 Vicryl's and the skin was closed with a 4-0 Monocryl. Sterile dressing was placed. The patient was taken to the recovery room in stable condition having tolerated the procedure well.  COMPLICATIONS: None  CONDITION: Stable   Leotis Pain 11/22/2021 12:06 PM  This note was created with Dragon Medical transcription system. Any errors in dictation are purely unintentional.

## 2021-11-22 NOTE — Anesthesia Postprocedure Evaluation (Signed)
Anesthesia Post Note  Patient: Preston Bryan  Procedure(s) Performed: BIOPSY TEMPORAL ARTERY (Left)  Patient location during evaluation: PACU Anesthesia Type: General Level of consciousness: awake and alert, oriented and patient cooperative Pain management: pain level controlled Vital Signs Assessment: post-procedure vital signs reviewed and stable Respiratory status: spontaneous breathing, nonlabored ventilation and respiratory function stable Cardiovascular status: blood pressure returned to baseline and stable Postop Assessment: adequate PO intake Anesthetic complications: no   No notable events documented.   Last Vitals:  Vitals:   11/22/21 1300 11/22/21 1313  BP: 114/70 120/71  Pulse: 66 71  Resp: 14 17  Temp: 36.6 C (!) 36.4 C  SpO2: 99% 100%    Last Pain:  Vitals:   11/22/21 1313  TempSrc: Temporal  PainSc: [place]

## 2021-11-22 NOTE — Anesthesia Preprocedure Evaluation (Addendum)
Anesthesia Evaluation  Patient identified by MRN, date of birth, ID band Patient awake    Reviewed: Allergy & Precautions, NPO status , Patient's Chart, lab work & pertinent test results  History of Anesthesia Complications Negative for: history of anesthetic complications  Airway Mallampati: IV   Neck ROM: Full    Dental no notable dental hx.    Pulmonary former smoker (quit 2012),    Pulmonary exam normal breath sounds clear to auscultation       Cardiovascular hypertension, + CAD and + Past MI  Normal cardiovascular exam Rhythm:Regular Rate:Normal  Echo 08/16/21:  NORMAL LEFT VENTRICULAR SYSTOLIC FUNCTION  NORMAL RIGHT VENTRICULAR SYSTOLIC FUNCTION  MILD VALVULAR REGURGITATION  NO VALVULAR STENOSIS    Neuro/Psych  Headaches,  Neuromuscular disease (CN VI palsy; myasthenia gravis, new diagnosis as of 08/2021, not on pyridostigmine yet)    GI/Hepatic negative GI ROS, (+) Cirrhosis       , Hepatocellular CA   Endo/Other  diabetes, Type 2Class 3 obesity  Renal/GU negative Renal ROS     Musculoskeletal   Abdominal   Peds  Hematology  (+) Blood dyscrasia (thrombocytopenia, plt 63 on 09/22/21), ,   Anesthesia Other Findings Reviewed 09/08/21 cardiology note.   Reproductive/Obstetrics                            Anesthesia Physical Anesthesia Plan  ASA: 4  Anesthesia Plan: General   Post-op Pain Management:    Induction: Intravenous  PONV Risk Score and Plan: 2 and Ondansetron, Dexamethasone and Treatment may vary due to age or medical condition  Airway Management Planned: LMA  Additional Equipment:   Intra-op Plan:   Post-operative Plan: Extubation in OR and Possible Post-op intubation/ventilation  Informed Consent: I have reviewed the patients History and Physical, chart, labs and discussed the procedure including the risks, benefits and alternatives for the proposed  anesthesia with the patient or authorized representative who has indicated his/her understanding and acceptance.     Dental advisory given  Plan Discussed with: CRNA  Anesthesia Plan Comments: (Considerations for myasthenia gravis: patients are resistant to succinylcholine and sensitive to NDMRs; plan to avoid NDMRs, or titrate to twitches if necessary.  Patient consented for postoperative ventilation if needed.  Patient consented for risks of anesthesia including but not limited to:  - adverse reactions to medications - damage to eyes, teeth, lips or other oral mucosa - nerve damage due to positioning  - sore throat or hoarseness - damage to heart, brain, nerves, lungs, other parts of body or loss of life  Informed patient about role of CRNA in peri- and intra-operative care.  Patient voiced understanding.)        Anesthesia Quick Evaluation

## 2021-11-22 NOTE — Anesthesia Procedure Notes (Addendum)
Procedure Name: LMA Insertion Date/Time: 11/22/2021 1:22 AM Performed by: Doreen Salvage, CRNA Pre-anesthesia Checklist: Patient identified, Patient being monitored, Timeout performed, Emergency Drugs available and Suction available Patient Re-evaluated:Patient Re-evaluated prior to induction Oxygen Delivery Method: Circle system utilized Preoxygenation: Pre-oxygenation with 100% oxygen Induction Type: IV induction Ventilation: Mask ventilation without difficulty LMA: LMA inserted LMA Size: 5.0 Tube type: Oral Number of attempts: 1 Placement Confirmation: positive ETCO2 and breath sounds checked- equal and bilateral Tube secured with: Tape Dental Injury: Teeth and Oropharynx as per pre-operative assessment

## 2021-11-23 ENCOUNTER — Encounter: Payer: Self-pay | Admitting: Vascular Surgery

## 2021-11-23 LAB — SURGICAL PATHOLOGY

## 2021-12-06 ENCOUNTER — Ambulatory Visit (INDEPENDENT_AMBULATORY_CARE_PROVIDER_SITE_OTHER): Payer: Medicare Other | Admitting: Nurse Practitioner

## 2021-12-18 ENCOUNTER — Ambulatory Visit: Payer: Medicare Other

## 2022-02-13 ENCOUNTER — Inpatient Hospital Stay: Payer: Medicare Other

## 2022-02-13 ENCOUNTER — Encounter: Payer: Self-pay | Admitting: Emergency Medicine

## 2022-02-13 ENCOUNTER — Emergency Department: Payer: Medicare Other

## 2022-02-13 ENCOUNTER — Other Ambulatory Visit: Payer: Self-pay

## 2022-02-13 ENCOUNTER — Observation Stay
Admission: EM | Admit: 2022-02-13 | Discharge: 2022-02-14 | Disposition: A | Discharge status: Home / Self Care | Payer: Medicare Other | Attending: Internal Medicine | Admitting: Internal Medicine

## 2022-02-13 DIAGNOSIS — D696 Thrombocytopenia, unspecified: Secondary | ICD-10-CM | POA: Diagnosis present

## 2022-02-13 DIAGNOSIS — R251 Tremor, unspecified: Secondary | ICD-10-CM | POA: Diagnosis not present

## 2022-02-13 DIAGNOSIS — E119 Type 2 diabetes mellitus without complications: Secondary | ICD-10-CM

## 2022-02-13 DIAGNOSIS — K7682 Hepatic encephalopathy: Secondary | ICD-10-CM | POA: Diagnosis not present

## 2022-02-13 DIAGNOSIS — Z79899 Other long term (current) drug therapy: Secondary | ICD-10-CM | POA: Diagnosis not present

## 2022-02-13 DIAGNOSIS — Z87891 Personal history of nicotine dependence: Secondary | ICD-10-CM | POA: Insufficient documentation

## 2022-02-13 DIAGNOSIS — I2510 Atherosclerotic heart disease of native coronary artery without angina pectoris: Secondary | ICD-10-CM | POA: Insufficient documentation

## 2022-02-13 DIAGNOSIS — N189 Chronic kidney disease, unspecified: Secondary | ICD-10-CM | POA: Diagnosis not present

## 2022-02-13 DIAGNOSIS — K7460 Unspecified cirrhosis of liver: Secondary | ICD-10-CM | POA: Diagnosis present

## 2022-02-13 DIAGNOSIS — I129 Hypertensive chronic kidney disease with stage 1 through stage 4 chronic kidney disease, or unspecified chronic kidney disease: Secondary | ICD-10-CM | POA: Diagnosis not present

## 2022-02-13 DIAGNOSIS — Z7984 Long term (current) use of oral hypoglycemic drugs: Secondary | ICD-10-CM | POA: Diagnosis not present

## 2022-02-13 DIAGNOSIS — R600 Localized edema: Secondary | ICD-10-CM | POA: Diagnosis present

## 2022-02-13 DIAGNOSIS — Z96652 Presence of left artificial knee joint: Secondary | ICD-10-CM | POA: Insufficient documentation

## 2022-02-13 DIAGNOSIS — R4701 Aphasia: Secondary | ICD-10-CM | POA: Diagnosis present

## 2022-02-13 DIAGNOSIS — Z8505 Personal history of malignant neoplasm of liver: Secondary | ICD-10-CM | POA: Insufficient documentation

## 2022-02-13 DIAGNOSIS — E1122 Type 2 diabetes mellitus with diabetic chronic kidney disease: Secondary | ICD-10-CM | POA: Diagnosis not present

## 2022-02-13 DIAGNOSIS — G4733 Obstructive sleep apnea (adult) (pediatric): Secondary | ICD-10-CM

## 2022-02-13 DIAGNOSIS — R531 Weakness: Secondary | ICD-10-CM

## 2022-02-13 LAB — DIFFERENTIAL
Abs Immature Granulocytes: 0.02 10*3/uL (ref 0.00–0.07)
Basophils Absolute: 0.1 10*3/uL (ref 0.0–0.1)
Basophils Relative: 1 %
Eosinophils Absolute: 0.2 10*3/uL (ref 0.0–0.5)
Eosinophils Relative: 5 %
Immature Granulocytes: 1 %
Lymphocytes Relative: 15 %
Lymphs Abs: 0.6 10*3/uL — ABNORMAL LOW (ref 0.7–4.0)
Monocytes Absolute: 0.6 10*3/uL (ref 0.1–1.0)
Monocytes Relative: 13 %
Neutro Abs: 2.8 10*3/uL (ref 1.7–7.7)
Neutrophils Relative %: 65 %

## 2022-02-13 LAB — CBC
HCT: 39.6 % (ref 39.0–52.0)
Hemoglobin: 13.5 g/dL (ref 13.0–17.0)
MCH: 30.9 pg (ref 26.0–34.0)
MCHC: 34.1 g/dL (ref 30.0–36.0)
MCV: 90.6 fL (ref 80.0–100.0)
Platelets: 54 10*3/uL — ABNORMAL LOW (ref 150–400)
RBC: 4.37 MIL/uL (ref 4.22–5.81)
RDW: 13.6 % (ref 11.5–15.5)
WBC: 4.3 10*3/uL (ref 4.0–10.5)
nRBC: 0 % (ref 0.0–0.2)

## 2022-02-13 LAB — COMPREHENSIVE METABOLIC PANEL
ALT: 44 U/L (ref 0–44)
AST: 69 U/L — ABNORMAL HIGH (ref 15–41)
Albumin: 3.7 g/dL (ref 3.5–5.0)
Alkaline Phosphatase: 189 U/L — ABNORMAL HIGH (ref 38–126)
Anion gap: 8 (ref 5–15)
BUN: 21 mg/dL — ABNORMAL HIGH (ref 6–20)
CO2: 26 mmol/L (ref 22–32)
Calcium: 9.3 mg/dL (ref 8.9–10.3)
Chloride: 102 mmol/L (ref 98–111)
Creatinine, Ser: 1.17 mg/dL (ref 0.61–1.24)
GFR, Estimated: >60 mL/min (ref >60)
Glucose, Bld: 139 mg/dL — ABNORMAL HIGH (ref 70–99)
Potassium: 4.5 mmol/L (ref 3.5–5.1)
Sodium: 136 mmol/L (ref 135–145)
Total Bilirubin: 2.1 mg/dL — ABNORMAL HIGH (ref 0.3–1.2)
Total Protein: 8.2 g/dL — ABNORMAL HIGH (ref 6.5–8.1)

## 2022-02-13 LAB — TYPE AND SCREEN
ABO/RH(D): O POS
Antibody Screen: NEGATIVE

## 2022-02-13 LAB — ETHANOL: Alcohol, Ethyl (B): <10 mg/dL (ref <10)

## 2022-02-13 LAB — MAGNESIUM: Magnesium: 2.1 mg/dL (ref 1.7–2.4)

## 2022-02-13 LAB — T4, FREE: Free T4: 1.04 ng/dL (ref 0.61–1.12)

## 2022-02-13 LAB — TSH
TSH: 1.655 u[IU]/mL (ref 0.350–4.500)
TSH: 1.917 u[IU]/mL (ref 0.350–4.500)

## 2022-02-13 LAB — AMMONIA: Ammonia: 56 umol/L — ABNORMAL HIGH (ref 9–35)

## 2022-02-13 LAB — PROTIME-INR
INR: 1.3 — ABNORMAL HIGH (ref 0.8–1.2)
Prothrombin Time: 15.6 seconds — ABNORMAL HIGH (ref 11.4–15.2)

## 2022-02-13 LAB — FERRITIN: Ferritin: 370 ng/mL — ABNORMAL HIGH (ref 24–336)

## 2022-02-13 LAB — APTT: aPTT: 38 seconds — ABNORMAL HIGH (ref 24–36)

## 2022-02-13 LAB — VITAMIN B12: Vitamin B-12: 1027 pg/mL — ABNORMAL HIGH (ref 180–914)

## 2022-02-13 IMAGING — CT CT HEAD W/O CM
3 series · 17 of 37 positions shown, 19 images · non-contrast
Comparison: [DATE]

CLINICAL DATA: Mental status change



[Series 2: head bone · axial · 0.43mm/px · z∈[-90,+32]mm · 7 of 88 slices shown]
[im 9/88  bone]
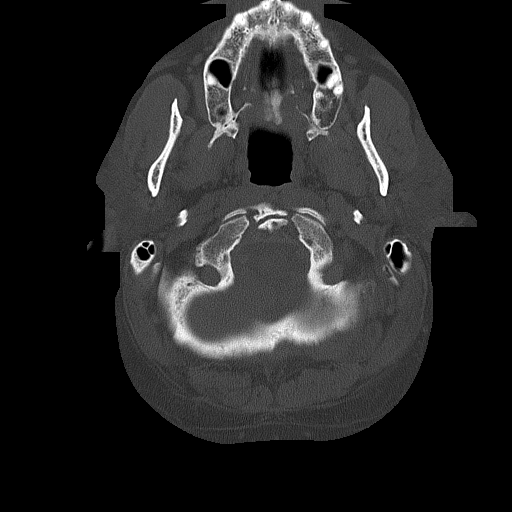
[im 18/88  bone]
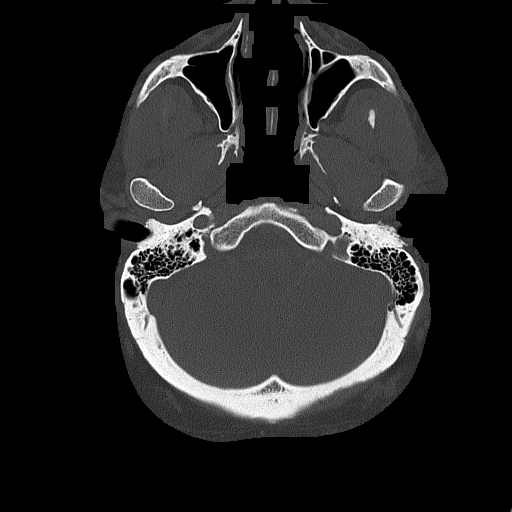
[im 27/88  bone]
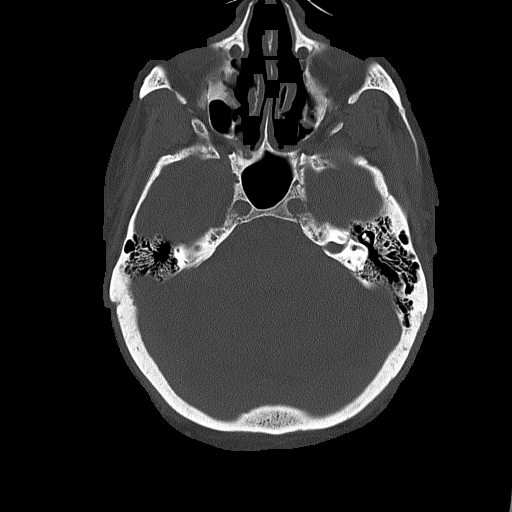
[im 40/88  bone]
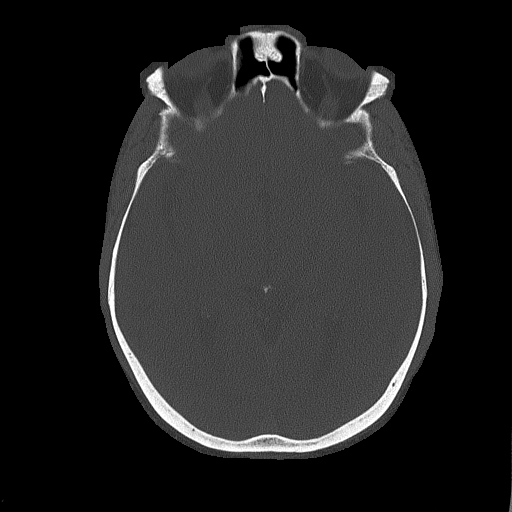
[im 48/88  bone]
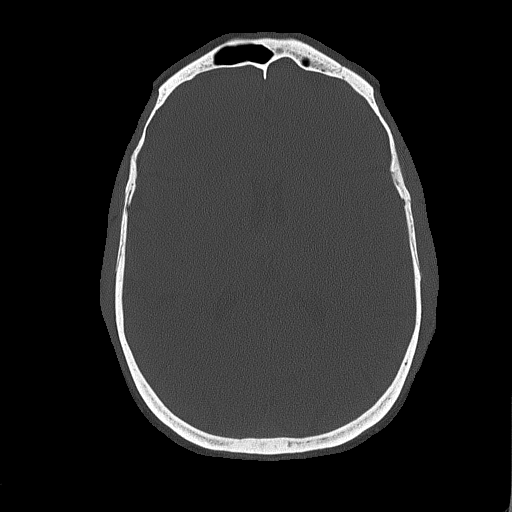
[im 61/88  bone]
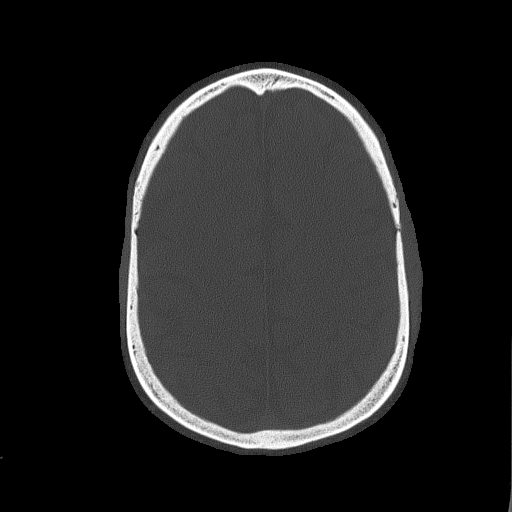
[im 70/88  bone]
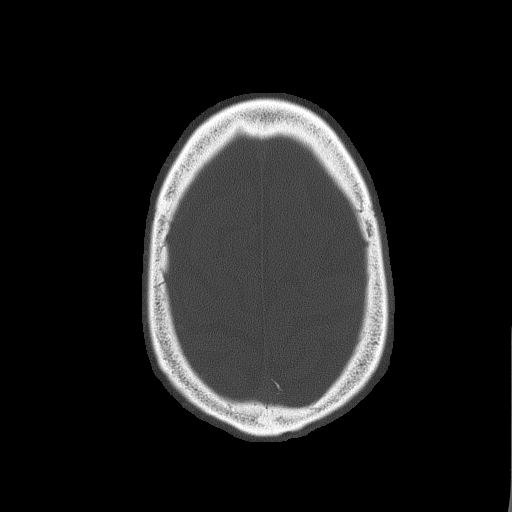

[Series 3: head wo · axial · 0.43mm/px · z∈[-86,+39]mm · 7 of 35 slices shown, 9 images]
[im 5/35  brain]
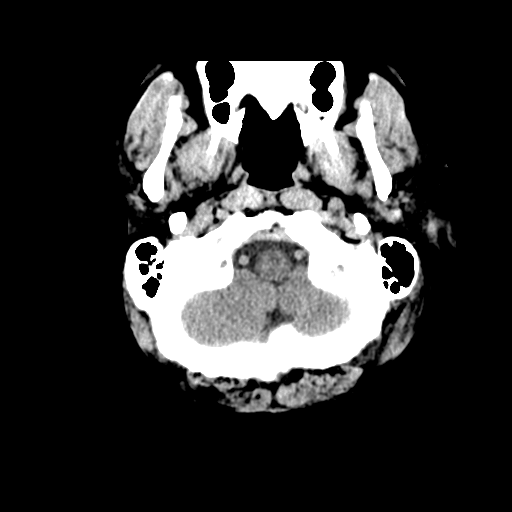
[im 5/35  bone]
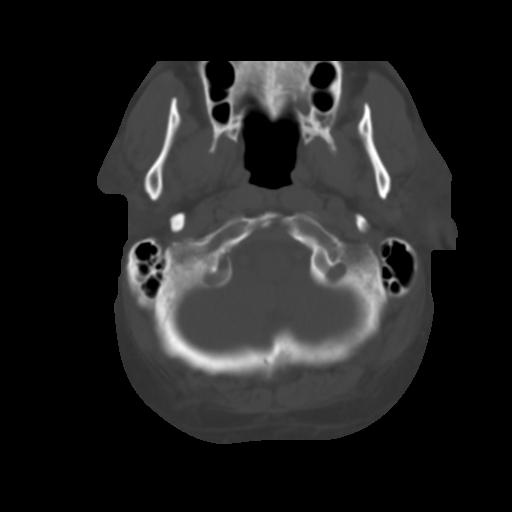
[im 9/35  brain]
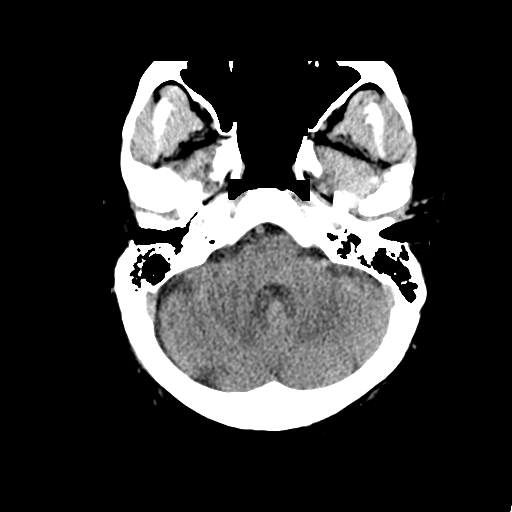
[im 13/35  brain]
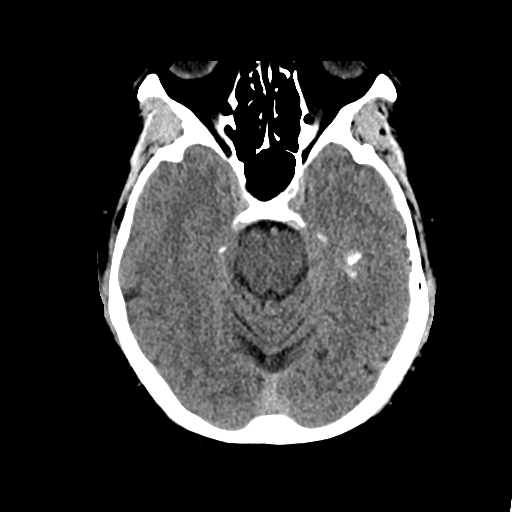
[im 18/35  brain]
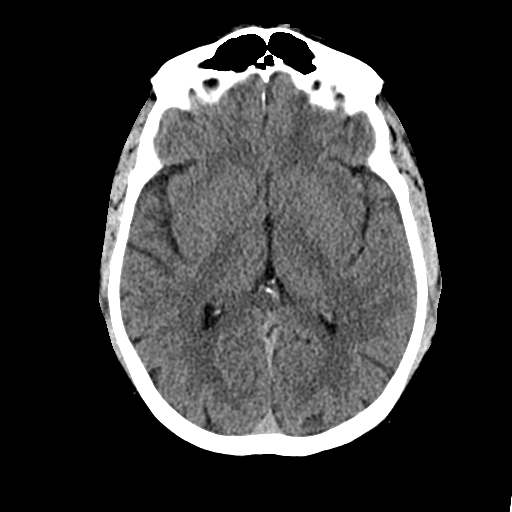
[im 22/35  brain]
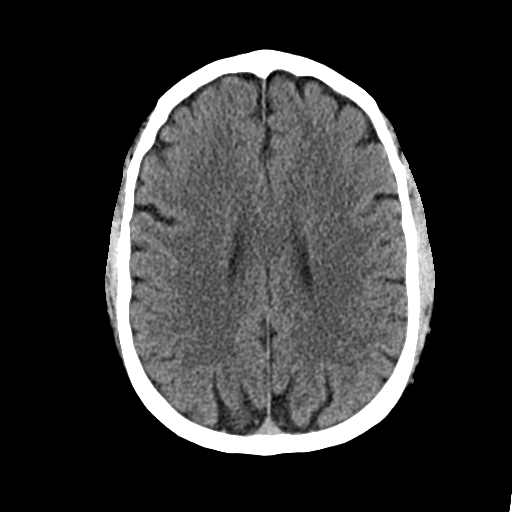
[im 22/35  bone]
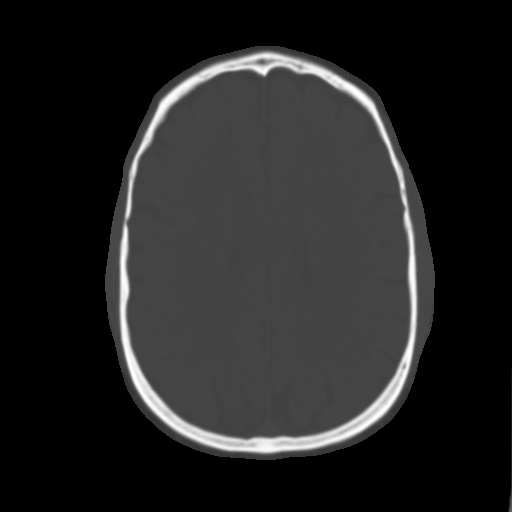
[im 26/35  brain]
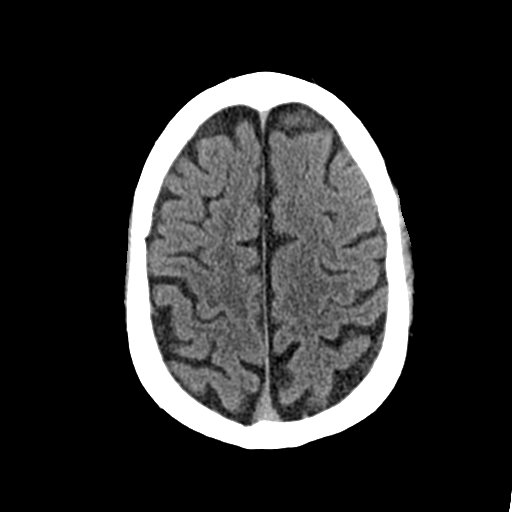
[im 30/35  brain]
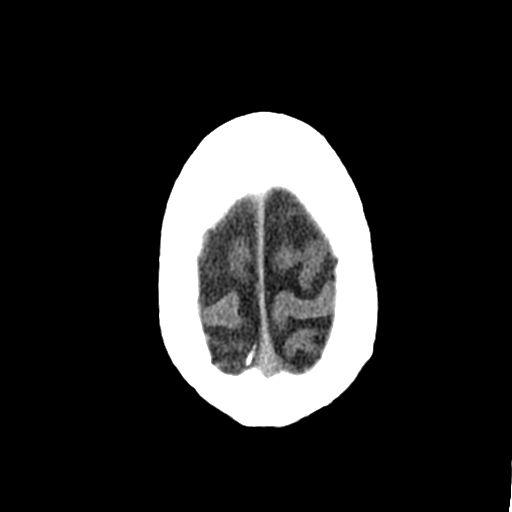

[Series 5: sagittal soft tissue · sagittal · 0.37mm/px · 3 of 65 slices shown]
[im 22/65  brain]
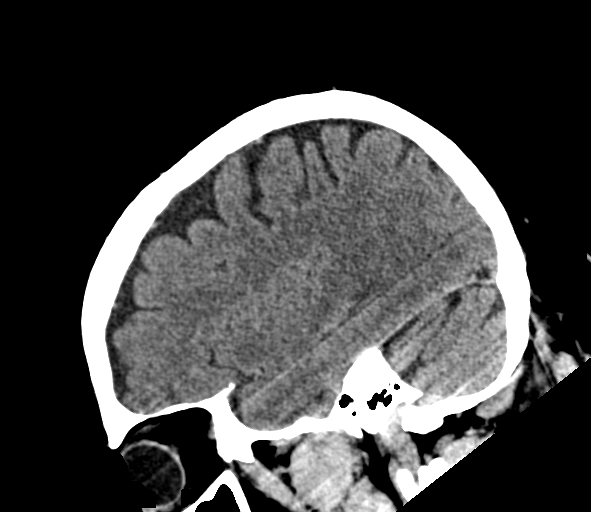
[im 33/65  brain]
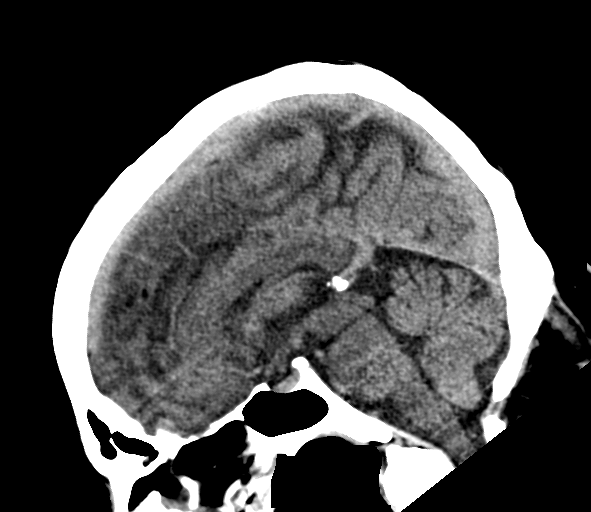
[im 43/65  brain]
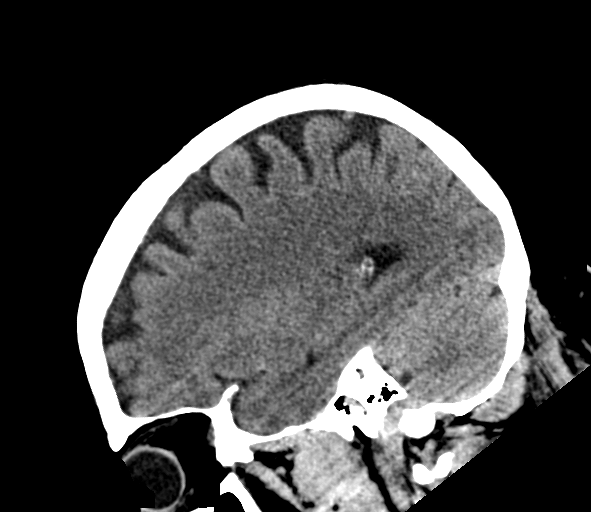

[17 of 37 positions shown; findings below may reference images not displayed]

FINDINGS: Brain: No evidence of acute infarction, hemorrhage, hydrocephalus,
extra-axial collection or mass lesion/mass effect.

Vascular: No hyperdense vessel or unexpected calcification.

Skull: No osseous abnormality.

Sinuses/Orbits: Visualized paranasal sinuses are clear. Visualized
mastoid sinuses are clear. Visualized orbits demonstrate no focal
abnormality.

Other: None
IMPRESSION: 1. No acute intracranial pathology.

## 2022-02-13 MED ORDER — PROPRANOLOL HCL 20 MG PO TABS
10.0000 mg | ORAL_TABLET | Freq: Three times a day (TID) | ORAL | Status: DC
  Administered 2022-02-13 – 2022-02-14 (×2): 10 mg via ORAL
  Filled 2022-02-13 (×2): qty 1

## 2022-02-13 MED ORDER — LACTATED RINGERS IV SOLN: INTRAVENOUS | Status: DC

## 2022-02-13 MED ORDER — BISACODYL 5 MG PO TBEC: 5.0000 mg | DELAYED_RELEASE_TABLET | Freq: Every day | ORAL | Status: DC | PRN

## 2022-02-13 MED ORDER — LACTULOSE 10 GM/15ML PO SOLN
20.0000 g | Freq: Once | ORAL | Status: AC
Start: 2022-02-13 — End: 2022-02-13
  Administered 2022-02-13: 20 g via ORAL
  Filled 2022-02-13: qty 30

## 2022-02-13 MED ORDER — BUMETANIDE 1 MG PO TABS: 2.0000 mg | ORAL_TABLET | Freq: Two times a day (BID) | ORAL | Status: DC

## 2022-02-13 MED ORDER — ATORVASTATIN CALCIUM 20 MG PO TABS
10.0000 mg | ORAL_TABLET | Freq: Every day | ORAL | Status: DC
  Administered 2022-02-14: 10 mg via ORAL
  Filled 2022-02-13: qty 1

## 2022-02-13 MED ORDER — HYDRALAZINE HCL 20 MG/ML IJ SOLN: 5.0000 mg | INTRAMUSCULAR | Status: DC | PRN

## 2022-02-13 MED ORDER — INSULIN ASPART 100 UNIT/ML IJ SOLN
0.0000 [IU] | Freq: Three times a day (TID) | INTRAMUSCULAR | Status: DC
  Administered 2022-02-14: 3 [IU] via SUBCUTANEOUS
  Filled 2022-02-13: qty 1

## 2022-02-13 MED ORDER — DOCUSATE SODIUM 100 MG PO CAPS
100.0000 mg | ORAL_CAPSULE | Freq: Two times a day (BID) | ORAL | Status: DC
  Administered 2022-02-13 – 2022-02-14 (×2): 100 mg via ORAL
  Filled 2022-02-13 (×2): qty 1

## 2022-02-13 MED ORDER — SPIRONOLACTONE 25 MG PO TABS
100.0000 mg | ORAL_TABLET | Freq: Every day | ORAL | Status: DC
  Administered 2022-02-14: 100 mg via ORAL
  Filled 2022-02-13: qty 4

## 2022-02-13 MED ORDER — HEPARIN SODIUM (PORCINE) 5000 UNIT/ML IJ SOLN: 5000.0000 [IU] | Freq: Three times a day (TID) | INTRAMUSCULAR | Status: DC

## 2022-02-13 MED ORDER — NORTRIPTYLINE HCL 10 MG PO CAPS
20.0000 mg | ORAL_CAPSULE | Freq: Every day | ORAL | Status: DC
Start: 2022-02-13 — End: 2022-02-14
  Administered 2022-02-13: 20 mg via ORAL
  Filled 2022-02-13: qty 2

## 2022-02-13 MED ORDER — SODIUM CHLORIDE 0.9% FLUSH
3.0000 mL | Freq: Two times a day (BID) | INTRAVENOUS | Status: DC
  Administered 2022-02-13: 3 mL via INTRAVENOUS

## 2022-02-13 MED ORDER — TRAZODONE HCL 50 MG PO TABS
50.0000 mg | ORAL_TABLET | Freq: Every day | ORAL | Status: DC
Start: 2022-02-13 — End: 2022-02-14
  Administered 2022-02-13: 50 mg via ORAL
  Filled 2022-02-13: qty 1

## 2022-02-13 MED ORDER — ALBUTEROL SULFATE (2.5 MG/3ML) 0.083% IN NEBU: 2.5000 mg | INHALATION_SOLUTION | RESPIRATORY_TRACT | Status: DC | PRN

## 2022-02-13 MED ORDER — ACETAMINOPHEN 325 MG RE SUPP: 650.0000 mg | Freq: Four times a day (QID) | RECTAL | Status: DC | PRN

## 2022-02-13 MED ORDER — FEBUXOSTAT 40 MG PO TABS
40.0000 mg | ORAL_TABLET | Freq: Every day | ORAL | Status: DC
  Administered 2022-02-14: 40 mg via ORAL
  Filled 2022-02-13: qty 1

## 2022-02-13 MED ORDER — FINASTERIDE 5 MG PO TABS
5.0000 mg | ORAL_TABLET | Freq: Every day | ORAL | Status: DC
  Administered 2022-02-14: 5 mg via ORAL
  Filled 2022-02-13: qty 1

## 2022-02-13 MED ORDER — LACTULOSE 10 GM/15ML PO SOLN
20.0000 g | Freq: Two times a day (BID) | ORAL | Status: DC
  Administered 2022-02-13 – 2022-02-14 (×2): 20 g via ORAL
  Filled 2022-02-13 (×2): qty 30

## 2022-02-13 MED ORDER — SODIUM BICARBONATE 650 MG PO TABS
650.0000 mg | ORAL_TABLET | Freq: Every day | ORAL | Status: DC
  Administered 2022-02-14: 650 mg via ORAL
  Filled 2022-02-13: qty 1

## 2022-02-13 MED ORDER — TORSEMIDE 20 MG PO TABS
20.0000 mg | ORAL_TABLET | Freq: Every day | ORAL | Status: DC
  Administered 2022-02-14: 20 mg via ORAL
  Filled 2022-02-13: qty 1

## 2022-02-13 MED ORDER — POLYETHYLENE GLYCOL 3350 17 G PO PACK: 17.0000 g | PACK | Freq: Every day | ORAL | Status: DC | PRN

## 2022-02-13 MED ORDER — GABAPENTIN 600 MG PO TABS
600.0000 mg | ORAL_TABLET | Freq: Three times a day (TID) | ORAL | Status: DC
Start: 2022-02-13 — End: 2022-02-14
  Administered 2022-02-13 – 2022-02-14 (×2): 600 mg via ORAL
  Filled 2022-02-13 (×2): qty 1

## 2022-02-13 MED ORDER — ACETAMINOPHEN 325 MG PO TABS: 650.0000 mg | ORAL_TABLET | Freq: Four times a day (QID) | ORAL | Status: DC | PRN

## 2022-02-13 NOTE — Assessment & Plan Note (Signed)
Combination of lymphedema and edema from cirrhosis. ?We will do venous dopplers to identify and DVT. ?

## 2022-02-13 NOTE — Assessment & Plan Note (Signed)
Combination of hypogonadism, obesity, deconditioning, and liver disease vit d def suspected and electrolytes. ?PT consult. ?Fall precaution. ? ?

## 2022-02-13 NOTE — Assessment & Plan Note (Signed)
Hold glipizide and continue pt on glycemic protocol.  ?

## 2022-02-13 NOTE — Assessment & Plan Note (Signed)
Cont statin at lower dose. ?Cont  hydralazine and propranolol.  ?

## 2022-02-13 NOTE — Assessment & Plan Note (Signed)
bipap per home settings. ?

## 2022-02-13 NOTE — H&P (Signed)
?History and Physical  ? ? ?Patient: Preston Bryan GGE:[id number] DOB: [birth year] ?DOA: 02/13/2022 ?DOS: the patient was seen and examined on 02/13/2022 ?PCP: Morton Amy, DO  ?Patient coming from: Home ? ?Chief Complaint:  ?Chief Complaint  ?Patient presents with  ? Aphasia  ? ?HPI: Preston Bryan is a 56 y.o. male with medical history significant of liver cirrhosis, diabetes mellitus, hypertension, heart disease, sleep apnea, thrombocytopenia coming for hard for him to speak and shaking with generalized weakness. Pt has never had cva and I suspect this is more encephalopathy related speech rather than cva. Pt has cirrhosis. He is alert but cant give clear history and is definitely confused. Pt also sees [facility] for liver cancer.  ? ?Chart reviewed and patient followed with [facility] health system closely for his liver disease, liver cancer and cirrhosis.  Patient was diagnosed with hepatocellular carcinoma and had radioablation on 10/26/2021.  Patient has been having weakness and some lid lag and there was concern for myasthenia gravis evaluation which was seen by neurology as well. ? ?Patient also has a history of heart disease and heart attack in 1991, 2012 and 2015. ?Patient's last GI/hepatology and transplant note showed the following: ?>MRI 12/27/2021 ?Hepatic findings: ?1. Stable segment 7 radioembolization ablation cavity without suspicious ?features. LR TR nonviable. ?2. Numerous arterial enhancing foci favored regenerative or dysplastic ?nodules. LR 3. ?  ?>MELD-Na score: 15 at 01/05/2022 11:37 AM ?MELD score: 15 at 01/05/2022 11:37 AM ?Calculated from: ?Serum Creatinine: 1.5 mg/dL at 01/05/2022 11:37 AM ?Serum Sodium: 137 mmol/L at 01/05/2022 11:37 AM ?Total Bilirubin: 1.8 mg/dL at 01/05/2022 11:37 AM ?INR(ratio): 1.2 at 01/05/2022 11:37 AM ?Age: 21 years ? ?Her last GI note patient was to stop Bumex and transition to torsemide daily, increase spironolactone to 200 mg daily.  Per GI note patient  is being considered for TIPS procedure.  I suspect patient does not look like he has ascites and is masked  due to his high-dose diuretic use secondary to his lymphedema and edema.   ? ?Review of Systems: Review of Systems  ?Constitutional:  Positive for malaise/fatigue.  ?Neurological:  Positive for tremors, speech change and weakness.  ? ?Past Medical History:  ?Diagnosis Date  ? Anginal pain ([place])   ? Cancer [facility])   ? liver  ? Cirrhosis ([place])   ? Diabetes mellitus without complication ([place])   ? Hyperlipidemia   ? Hypertension   ? Myocardial infarction [facility])   ? Sleep apnea   ? Thrombocytopenia ([place])   ? ?Past Surgical History:  ?Procedure Laterality Date  ? APPENDECTOMY    ? ARTERY BIOPSY Left 11/22/2021  ? Procedure: BIOPSY TEMPORAL ARTERY;  Surgeon: Algernon Huxley, MD;  Location: [facility] ORS;  Service: Vascular;  Laterality: Left;  ? CARDIAC CATHETERIZATION    ? CHOLECYSTECTOMY    ? COLONOSCOPY    ? REPLACEMENT TOTAL KNEE Left   ? TONSILLECTOMY    ? TONSILLECTOMY AND ADENOIDECTOMY    ? UPPER GASTROINTESTINAL ENDOSCOPY    ? WRIST SURGERY Right   ? ?Social History:  reports that he quit smoking about 11 years ago. His smoking use included cigarettes. He has never used smokeless tobacco. He reports that he does not currently use alcohol. He reports that he does not use drugs. ? ?No Known Allergies ? ?Family History  ?Problem Relation Age of Onset  ? Arthritis Mother   ? Diabetes Father   ? ? ?Prior to Admission medications   ?Medication Sig Start Date End  Date Taking? Authorizing Provider  ?atorvastatin (LIPITOR) 20 MG tablet Take 20 mg by mouth daily. 10/07/21   [provider]  ?febuxostat (ULORIC) 40 MG tablet Take 40 mg by mouth daily. 10/19/21   [provider]  ?finasteride (PROSCAR) 5 MG tablet Take 5 mg by mouth daily. 08/11/21   [provider]  ?furosemide (LASIX) 40 MG tablet Take 80 mg by mouth 2 (two) times daily. 10/06/21   [provider]  ?glipiZIDE (GLUCOTROL) 5 MG  tablet Take 5 mg by mouth 2 (two) times daily before a meal.    [provider]  ?lactulose (CHRONULAC) 10 GM/15ML solution Take 20 g by mouth 2 (two) times daily. 10/07/21   [provider]  ?Oxycodone HCl 10 MG TABS Take 10 mg by mouth 4 (four) times daily as needed. 10/16/21   [provider]  ?propranolol (INDERAL) 10 MG tablet Take 10 mg by mouth 3 (three) times daily. 11/06/21   [provider]  ?sodium bicarbonate 650 MG tablet Take 650 mg by mouth daily. 07/11/21   [provider]  ?spironolactone (ALDACTONE) 100 MG tablet Take 100 mg by mouth daily. 09/28/21   [provider]  ?Testosterone 12.5 MG/ACT (1%) GEL Apply 6 Pump topically daily. 07/19/21   [provider]  ?traZODone (DESYREL) 50 MG tablet Take 50 mg by mouth at bedtime. 10/07/21   [provider]  ? ? ?Physical Exam: ?Vitals:  ? 02/13/22 1622 02/13/22 1731 02/13/22 1732 02/13/22 1830  ?BP: (!) 147/90 (!) 153/75  127/71  ?Pulse: 91 87 87 80  ?Resp: '20 18  16  '$ ?Temp:      ?TempSrc:      ?SpO2: 98% 97% 97% 95%  ?Weight:      ?Height:      ? ?Physical Exam ?Vitals and nursing note reviewed.  ?Constitutional:   ?   General: He is not in acute distress. ?   Appearance: Normal appearance. He is obese. He is not ill-appearing, toxic-appearing or diaphoretic.  ?HENT:  ?   Head: Normocephalic and atraumatic.  ?   Right Ear: Hearing and external ear normal.  ?   Left Ear: Hearing and external ear normal.  ?   Nose: Nose normal. No nasal deformity.  ?   Mouth/Throat:  ?   Lips: Pink.  ?   Mouth: Mucous membranes are moist.  ?   Tongue: No lesions.  ?   Pharynx: Oropharynx is clear.  ?Eyes:  ?   Extraocular Movements: Extraocular movements intact.  ?   Pupils: Pupils are equal, round, and reactive to light.  ?Neck:  ?   Vascular: No carotid bruit.  ?Cardiovascular:  ?   Rate and Rhythm: Normal rate and regular rhythm.  ?   Pulses: Normal pulses.  ?   Heart sounds: Normal heart sounds.   ?Pulmonary:  ?   Effort: Pulmonary effort is normal.  ?   Breath sounds: Normal breath sounds.  ?Abdominal:  ?   General: Bowel sounds are normal. There is no distension.  ?   Palpations: Abdomen is soft. There is no mass.  ?   Tenderness: There is no abdominal tenderness. There is no guarding.  ?   Hernia: No hernia is present.  ?Musculoskeletal:  ?   Right lower leg: Edema present.  ?   Left lower leg: Edema present.  ?Skin: ?   General: Skin is warm.  ?   Findings: Erythema present.  ?Neurological:  ?  General: No focal deficit present.  ?   Mental Status: He is alert. He is confused.  ?   Cranial Nerves: Cranial nerves 2-12 are intact.  ?   Motor: Motor function is intact.  ?Psychiatric:     ?   Attention and Perception: Attention normal.     ?   Mood and Affect: Mood normal.     ?   Speech: Speech normal.     ?   Behavior: Behavior normal. Behavior is cooperative.     ?   Cognition and Memory: Cognition normal.  ? ? ?Data Reviewed: ?Results for orders placed or performed during the hospital encounter of 02/13/22 (from the past 24 hour(s))  ?Protime-INR     Status: Abnormal  ? Collection Time: 02/13/22  1:34 PM  ?Result Value Ref Range  ? Prothrombin Time 15.6 (H) 11.4 - 15.2 seconds  ? INR 1.3 (H) 0.8 - 1.2  ?APTT     Status: Abnormal  ? Collection Time: 02/13/22  1:34 PM  ?Result Value Ref Range  ? aPTT 38 (H) 24 - 36 seconds  ?CBC     Status: Abnormal  ? Collection Time: 02/13/22  1:34 PM  ?Result Value Ref Range  ? WBC 4.3 4.0 - 10.5 K/uL  ? RBC 4.37 4.22 - 5.81 MIL/uL  ? Hemoglobin 13.5 13.0 - 17.0 g/dL  ? HCT 39.6 39.0 - 52.0 %  ? MCV 90.6 80.0 - 100.0 fL  ? MCH 30.9 26.0 - 34.0 pg  ? MCHC 34.1 30.0 - 36.0 g/dL  ? RDW 13.6 11.5 - 15.5 %  ? Platelets 54 (L) 150 - 400 K/uL  ? nRBC 0.0 0.0 - 0.2 %  ?Differential     Status: Abnormal  ? Collection Time: 02/13/22  1:34 PM  ?Result Value Ref Range  ? Neutrophils Relative % 65 %  ? Neutro Abs 2.8 1.7 - 7.7 K/uL  ? Lymphocytes Relative 15 %  ? Lymphs Abs 0.6 (L)  0.7 - 4.0 K/uL  ? Monocytes Relative 13 %  ? Monocytes Absolute 0.6 0.1 - 1.0 K/uL  ? Eosinophils Relative 5 %  ? Eosinophils Absolute 0.2 0.0 - 0.5 K/uL  ? Basophils Relative 1 %  ? Basophils Absolute 0.1 0.

## 2022-02-13 NOTE — Assessment & Plan Note (Addendum)
Pt is alert but disoriented. ?Appear to be perseverating when answering questions,. ?We will obtain Mri to rule out cva.  ?Neuro checks. ?antiplatelets held due to thrombocytopenia.  ? ? ? ?

## 2022-02-13 NOTE — ED Provider Notes (Signed)
? ?[facility] ?Provider Note ? ? ? Event Date/Time  ? First MD Initiated Contact with Patient 02/13/22 1627   ?  (approximate) ? ? ?History  ? ?Chief Complaint ?Aphasia ? ? ?HPI ? ?Everado Pillsbury is a 56 y.o. male with past medical history of hypertension, hyperlipidemia, diabetes, CAD, cirrhosis, hepatocellular carcinoma, CKD, and lymphedema who presents to the ED complaining of aphasia.  Patient reports that for the past 2 days he is having gradually worsening difficulty speaking along with shaking and weakness in both hands.  He states that he is having difficulty using his hands, as when he does so they begin to shake and he drops whenever he is holding.  He particularly notices it when he is attempting to use his phone or have something to eat.  He denies any numbness or tingling in his hands, has not noticed any visual changes, and denies any numbness or weakness in his legs.  He also feels like it is very difficult for him to get words out at times and reports significant difficulties with his memory and cognitive function.  He follows with Dr. Melrose Nakayama of neurology, mentioned the symptoms earlier today and was referred to the ED for further evaluation.  Patient denies any fevers, cough, chest pain, shortness of breath, nausea, vomiting, or changes in bowel movements.  He reports being compliant with his lactulose with a bowel movement earlier today, denies any alcohol with consumption in the past 10 years. ?  ? ? ?Physical Exam  ? ?Triage Vital Signs: ?ED Triage Vitals  ?Enc Vitals Group  ?   BP 02/13/22 1330 (!) 119/91  ?   Pulse Rate 02/13/22 1330 (!) 103  ?   Resp 02/13/22 1330 18  ?   Temp 02/13/22 1330 98.3 ?F (36.8 ?C)  ?   Temp Source 02/13/22 1330 Oral  ?   SpO2 02/13/22 1330 95 %  ?   Weight 02/13/22 1330 (!) 365 lb (165.6 kg)  ?   Height 02/13/22 1330 '6\' 2"'$  (1.88 m)  ?   Head Circumference --   ?   Peak Flow --   ?   Pain Score 02/13/22 1326 0  ?   Pain Loc --   ?   Pain Edu? --    ?   Excl. in [place]? --   ? ? ?Most recent vital signs: ?Vitals:  ? 02/13/22 1732 02/13/22 1830  ?BP:  127/71  ?Pulse: 87 80  ?Resp:  16  ?Temp:    ?SpO2: 97% 95%  ? ? ?Constitutional: Alert and oriented to person, place, time, and situation. ?Eyes: Conjunctivae are normal.  Pupils equal, round, and reactive to light bilaterally. ?Head: Atraumatic. ?Nose: No congestion/rhinnorhea. ?Mouth/Throat: Mucous membranes are moist.  ?Cardiovascular: Normal rate, regular rhythm. Grossly normal heart sounds.  2+ radial pulses bilaterally. ?Respiratory: Normal respiratory effort.  No retractions. Lungs CTAB. ?Gastrointestinal: Soft and nontender. No distention. ?Musculoskeletal: No lower extremity tenderness nor edema.  ?Neurologic:  Normal speech and language.  4+ out of 5 strength in bilateral upper extremities with mild asterixis noted.  5 out of 5 strength in bilateral lower extremities but no clonus. ? ? ? ?ED Results / Procedures / Treatments  ? ?Labs ?(all labs ordered are listed, but only abnormal results are displayed) ?Labs Reviewed  ?PROTIME-INR - Abnormal; Notable for the following components:  ?    Result Value  ? Prothrombin Time 15.6 (*)   ? INR 1.3 (*)   ? All  other components within normal limits  ?APTT - Abnormal; Notable for the following components:  ? aPTT 38 (*)   ? All other components within normal limits  ?CBC - Abnormal; Notable for the following components:  ? Platelets 54 (*)   ? All other components within normal limits  ?DIFFERENTIAL - Abnormal; Notable for the following components:  ? Lymphs Abs 0.6 (*)   ? All other components within normal limits  ?COMPREHENSIVE METABOLIC PANEL - Abnormal; Notable for the following components:  ? Glucose, Bld 139 (*)   ? BUN 21 (*)   ? Total Protein 8.2 (*)   ? AST 69 (*)   ? Alkaline Phosphatase 189 (*)   ? Total Bilirubin 2.1 (*)   ? All other components within normal limits  ?AMMONIA - Abnormal; Notable for the following components:  ? Ammonia 56 (*)   ? All  other components within normal limits  ?MAGNESIUM  ?TSH  ?VITAMIN B12  ?CBG MONITORING, ED  ? ? ? ?EKG ? ?ED ECG REPORT ?Tempie Hoist, the attending physician, personally viewed and interpreted this ECG. ? ? Date: 02/13/2022 ? EKG Time: 13:29 ? Rate: 97 ? Rhythm: normal sinus rhythm ? Axis: Normal ? Intervals:none ? ST&T Change: None ? ?RADIOLOGY ?CT head reviewed by me with no hemorrhage or midline shift. ? ?PROCEDURES: ? ?Critical Care performed: No ? ?Procedures ? ? ?MEDICATIONS ORDERED IN ED: ?Medications  ?lactulose (CHRONULAC) 10 GM/15ML solution 20 g (20 g Oral Given 02/13/22 1849)  ? ? ? ?IMPRESSION / MDM / ASSESSMENT AND PLAN / ED COURSE  ?I reviewed the triage vital signs and the nursing notes. ?             ?               ? ?56 y.o. male with past medical history of hypertension, hyperlipidemia, diabetes, CAD, cirrhosis, hepatocellular carcinoma, CKD, and lymphedema who presents to the ED complaining of 2 days of increasing aphasia with upper extremity weakness and shaking. ? ?Differential diagnosis includes, but is not limited to, stroke, intracranial hemorrhage, electrolyte abnormality, myasthenia gravis, hepatic encephalopathy, B12 deficiency, hypothyroidism, serotonin syndrome. ? ?Patient nontoxic-appearing and in no acute distress, appears weak in both arms with some shaking similar to asterixis.  Initial work-up is reassuring with CT head negative for acute process, BMP without electrolyte abnormality and LFTs consistent with known chronic liver disease.  CBC shows no anemia or leukocytosis, no findings to suggest infectious process.  We will add on TSH, ammonia, B12 level, and magnesium level.  No stiffness or clonus to suggest serotonin syndrome although he was recently started on nortriptyline.  Patient would likely benefit from admission for MRI and further neurology evaluation.  Recent admission at [facility] was reviewed and work-up at that time was not consistent with myasthenia gravis given  negative single-fiber EMG as well as negative antibodies. ? ?TSH and magnesium levels within normal limits, however ammonia level is elevated which is the likely cause of patient's symptoms.  Stroke considered less likely at this time and we will hold off on MRI, patient given initial dose of lactulose.  Case discussed with hospitalist for admission. ? ?  ? ? ?FINAL CLINICAL IMPRESSION(S) / ED DIAGNOSES  ? ?Final diagnoses:  ?Hepatic encephalopathy ([place])  ?Tremor  ? ? ? ?Rx / DC Orders  ? ?ED Discharge Orders   ? ? None  ? ?  ? ? ? ?Note:  This document was prepared using Dragon voice  recognition software and may include unintentional dictation errors. ?  ?Blake Divine, MD ?02/13/22 1933 ? ?

## 2022-02-13 NOTE — Assessment & Plan Note (Signed)
Attribute to protal htn. ?We will hold any antiplatelets.  ? ?

## 2022-02-13 NOTE — Assessment & Plan Note (Signed)
Pt has liver cirrhosis and is compensated.  ?We will continue aldactone and propranolol and torsemide. ? ?

## 2022-02-13 NOTE — ED Triage Notes (Signed)
Pt via POV from home. Pt c/o bilateral hand tremors, difficulty speaking for the past two day but today it got worse. Pt states his motor function in both of his hands are bad. No numbness or tingling. Pt is A&OX4 and NAD but pt is having difficulty getting his worse his words out.  ?

## 2022-02-14 ENCOUNTER — Inpatient Hospital Stay: Payer: Medicare Other

## 2022-02-14 DIAGNOSIS — R4701 Aphasia: Secondary | ICD-10-CM | POA: Diagnosis not present

## 2022-02-14 LAB — CBG MONITORING, ED: Glucose-Capillary: 190 mg/dL — ABNORMAL HIGH (ref 70–99)

## 2022-02-14 LAB — GAMMA GT: GGT: 314 U/L — ABNORMAL HIGH (ref 7–50)

## 2022-02-14 LAB — HEMOGLOBIN A1C
Hgb A1c MFr Bld: 5.7 % — ABNORMAL HIGH (ref 4.8–5.6)
Mean Plasma Glucose: 116.89 mg/dL

## 2022-02-14 LAB — HIV ANTIBODY (ROUTINE TESTING W REFLEX): HIV Screen 4th Generation wRfx: NONREACTIVE

## 2022-02-14 MED ORDER — OXYCODONE HCL 5 MG PO TABS
10.0000 mg | ORAL_TABLET | Freq: Four times a day (QID) | ORAL | Status: DC | PRN
  Administered 2022-02-14: 10 mg via ORAL
  Filled 2022-02-14: qty 2

## 2022-02-14 NOTE — Discharge Summary (Signed)
?Physician Discharge Summary ?  ?Patient: Preston Bryan MRN: [id number] DOB: 18-Jul-1966  ?Admit date:     02/13/2022  ?Discharge date: 02/14/22  ?Discharge Physician: Lorella Nimrod  ? ?PCP: Deal, Rulon Eisenmenger, DO  ? ?Recommendations at discharge:  ?Follow-up with primary care doctor within a week ?Follow-up with your hepatologist ?Follow-up with your gastroenterologist for esophageal issues. ? ?Discharge Diagnoses: ?Principal Problem: ?  Aphasia ?Active Problems: ?  Generalized weakness ?  Bilateral leg edema ?  Cirrhosis of liver without ascites ([place]) ?  Thrombocytopenia ([place]) ?  Coronary artery disease involving native coronary artery of native heart without angina pectoris ?  OSA on CPAP ?  Type 2 diabetes mellitus without complication, without long-term current use of insulin ([place]) ? ? ?Hospital Course: ?Preston Bryan is a 56 y.o. male with medical history significant of liver cirrhosis, diabetes mellitus, hypertension, heart disease, sleep apnea, thrombocytopenia coming for hard for him to speak and shaking with generalized weakness. Pt has never had cva and I suspect this is more encephalopathy related speech rather than cva. Pt has cirrhosis. He is alert but cant give clear history and is definitely confused. Pt also sees Preston Bryan for liver cancer.  ?  ?Chart reviewed and patient followed with [facility] system closely for his liver disease, liver cancer and cirrhosis.  Patient was diagnosed with hepatocellular carcinoma and had radioablation on 10/26/2021.  Patient has been having weakness and some lid lag and there was concern for myasthenia gravis evaluation which was seen by neurology as well. ?  ?Patient also has a history of heart disease and heart attack in 1991, 2012 and 2015. ?Patient's last GI/Bryan and transplant note showed the following: ?>MRI 12/27/2021 ?Hepatic findings: ?1. Stable segment 7 radioembolization ablation cavity without suspicious ?features. LR TR nonviable. ?2.  Numerous arterial enhancing foci favored regenerative or dysplastic ?nodules. LR 3. ?  ?>MELD-Na score: 15 at 01/05/2022 11:37 AM ?MELD score: 15 at 01/05/2022 11:37 AM ?Calculated from: ?Serum Creatinine: 1.5 mg/dL at 01/05/2022 11:37 AM ?Serum Sodium: 137 mmol/L at 01/05/2022 11:37 AM ?Total Bilirubin: 1.8 mg/dL at 01/05/2022 11:37 AM ?INR(ratio): 1.2 at 01/05/2022 11:37 AM. ? ?Patient's symptoms resolved spontaneously.  MRI brain was within normal limit and no acute abnormality found.  Labs appears to be around his baseline.  Patient has chronic lymphedema for which he is being seen at lymphedema clinic and have an appointment today around noon.  Able to tolerate diet without any difficulty.  He thinks that he is at his baseline.  He is being discharged so he can follow-up with his providers. ? ?Patient also met the criteria for class III obesity which will complicate overall prognosis. ? ?Patient will continue with his current medications and follow-up with his providers. ? ? ?Assessment and Plan: ?* Aphasia ?Pt is alert but disoriented. ?Appear to be perseverating when answering questions,. ?We will obtain Mri to rule out cva.  ?Neuro checks. ?antiplatelets held due to thrombocytopenia.  ? ? ? ? ?Generalized weakness ?Combination of hypogonadism, obesity, deconditioning, and liver disease vit d def suspected and electrolytes. ?PT consult. ?Fall precaution. ? ? ?Bilateral leg edema ?Combination of lymphedema and edema from cirrhosis. ?We will do venous dopplers to identify and DVT. ? ?Cirrhosis of liver without ascites (Preston Bryan) ?Pt has liver cirrhosis and is compensated.  ?We will continue aldactone and propranolol and torsemide. ? ? ?Thrombocytopenia (Preston Bryan) ?Attribute to protal htn. ?We will hold any antiplatelets.  ? ? ?Type 2 diabetes mellitus without complication, without long-term  current use of insulin (Preston Bryan) ?Hold glipizide and continue pt on glycemic protocol.  ? ?OSA on CPAP ?bipap per home settings. ? ?Coronary  artery disease involving native coronary artery of native heart without angina pectoris ?Cont statin at lower dose. ?Cont  hydralazine and propranolol.  ? ? ?Pain control - [employer] Controlled Substance Reporting System database was reviewed. and patient was instructed, not to drive, operate heavy machinery, perform activities at heights, swimming or participation in water activities or provide baby-sitting services while on Pain, Sleep and Anxiety Medications; until their outpatient Physician has advised to do so again. Also recommended to not to take more than prescribed Pain, Sleep and Anxiety Medications.  ? ?Consultants: None ?Procedures performed: None ?Disposition: Home ?Diet recommendation:  ?Discharge Diet Orders (From admission, onward)  ? ?  Start     Ordered  ? 02/14/22 0000  Diet - low sodium heart healthy       ? 02/14/22 1009  ? ?  ?  ? ?  ? ?Cardiac and Carb modified diet ?DISCHARGE MEDICATION: ?Allergies as of 02/14/2022   ?No Known Allergies ?  ? ?  ?Medication List  ?  ? ?STOP taking these medications   ? ?bumetanide 2 MG tablet ?Commonly known as: BUMEX ?  ?furosemide 40 MG tablet ?Commonly known as: LASIX ?  ?glipiZIDE 5 MG tablet ?Commonly known as: GLUCOTROL ?  ?propranolol 10 MG tablet ?Commonly known as: INDERAL ?  ?pyridostigmine 60 MG tablet ?Commonly known as: MESTINON ?  ? ?  ? ?TAKE these medications   ? ?atorvastatin 20 MG tablet ?Commonly known as: LIPITOR ?Take 20 mg by mouth daily. ?  ?febuxostat 40 MG tablet ?Commonly known as: ULORIC ?Take 40 mg by mouth daily. ?  ?finasteride 5 MG tablet ?Commonly known as: PROSCAR ?Take 5 mg by mouth daily. ?  ?gabapentin 600 MG tablet ?Commonly known as: NEURONTIN ?Take 300-600 mg by mouth 3 (three) times daily. Take 600 mg in the morning and 300 mg in the afternoon and evening. ?  ?lactulose 10 GM/15ML solution ?Commonly known as: [place] ?Take 20 g by mouth 2 (two) times daily. ?  ?nortriptyline 10 MG capsule ?Commonly known as:  PAMELOR ?Take 20 mg by mouth at bedtime. ?  ?Oxycodone HCl 10 MG Tabs ?Take 10 mg by mouth 4 (four) times daily as needed. ?  ?sodium bicarbonate 650 MG tablet ?Take 650 mg by mouth daily. ?  ?spironolactone 100 MG tablet ?Commonly known as: ALDACTONE ?Take 100 mg by mouth daily. ?  ?Testosterone 12.5 MG/ACT (1%) Gel ?Apply 6 Pump topically daily. ?  ?torsemide 20 MG tablet ?Commonly known as: DEMADEX ?Take 20 mg by mouth daily. ?  ?traZODone 50 MG tablet ?Commonly known as: DESYREL ?Take 50 mg by mouth at bedtime. ?  ? ?  ? ? Follow-up Information   ? ? Deal, Rulon Eisenmenger, DO. Schedule an appointment as soon as possible for a visit.   ?Specialty: Family Medicine ?Contact information: ?[address] ?[place] [address] ?[phone] ? ? ?  ?  ? ?  ?  ? ?  ? ?Discharge Exam: ?Filed Weights  ? 02/13/22 1330  ?Weight: (!) 165.6 kg  ? ?General.  Morbidly obese gentleman, in no acute distress. ?Pulmonary.  Lungs clear bilaterally, normal respiratory effort. ?CV.  Regular rate and rhythm, no JVD, rub or murmur. ?Abdomen.  Soft, nontender, nondistended, BS positive. ?CNS.  Alert and oriented x3.  No focal neurologic deficit. ?Extremities.  1+ LE edema  with lymphedema and signs of chronic venous congestion. ?Psychiatry.  Judgment and insight appears normal.  ? ?Condition at discharge: stable ? ?The results of significant diagnostics from this hospitalization (including imaging, microbiology, ancillary and laboratory) are listed below for reference.  ? ?Imaging Studies: ?CT HEAD WO CONTRAST ? ?Result Date: 02/13/2022 ?CLINICAL DATA:  Mental status change EXAM: CT HEAD WITHOUT CONTRAST TECHNIQUE: Contiguous axial images were obtained from the base of the skull through the vertex without intravenous contrast. RADIATION DOSE REDUCTION: This exam was performed according to the departmental dose-optimization program which includes automated exposure control, adjustment of the mA and/or kV according to patient size  and/or use of iterative reconstruction technique. COMPARISON:  09/22/2021 FINDINGS: Brain: No evidence of acute infarction, hemorrhage, hydrocephalus, extra-axial collection or mass lesion/mass effect. Vascular:

## 2022-02-14 NOTE — Progress Notes (Signed)
PT Cancellation Note ? ?Patient Details ?Name: Preston Bryan ?MRN: [id number] ?DOB: 1966/02/09 ? ? ?Cancelled Treatment:    Reason Eval/Treat Not Completed: PT screened, no needs identified, will sign off. Patient just worked with OT who states patient has no PT needs, patient in agreement. Will sign off. Thank you for referral.  ? ? ?Jeyson Deshotel ?02/14/2022, 10:11 AM ?

## 2022-02-14 NOTE — Evaluation (Signed)
Occupational Therapy Evaluation ?Patient Details ?Name: Preston Bryan ?MRN: [id number] ?DOB: [birth year] ?Today's Date: 02/14/2022 ? ? ?History of Present Illness Pt is a 56 year old male admitted due to shaking and generalized weakness, word finding difficulties. PMH significant forliver cirrhosis, diabetes mellitus, hypertension, heart disease, sleep apnea, thrombocytopenia  ? ?Clinical Impression ?  ?Pt admitted with above symptoms, reports no change in functional status besides tremors which appear to be improving. Pt reports tremors have occurred when attempting to text.Tremors not noted during eval on this date. Pt requiring slightly increased processing time for providing biographical information however follows multi step directions with good accuracy. ADL mobility and tasks completed at a MOD I level. Pt reports he feels he is at baseline. No other functional deficits noted at this time. No further OT warranted at this time. Please re-consult if there is a change in functional status. Pt is in agreement.  ?   ? ?Recommendations for follow up therapy are one component of a multi-disciplinary discharge planning process, led by the attending physician.  Recommendations may be updated based on patient status, additional functional criteria and insurance authorization.  ? ?Follow Up Recommendations ? No OT follow up  ?  ?Assistance Recommended at Discharge PRN  ?Patient can return home with the following   ? ?  ?Functional Status Assessment ? Patient has not had a recent decline in their functional status  ?Equipment Recommendations ? Other (comment) (pt has recommended equipment)  ?  ?Recommendations for Other Services   ? ? ?  ?Precautions / Restrictions Precautions ?Precautions: Fall ?Restrictions ?Weight Bearing Restrictions: No  ? ?  ? ?Mobility Bed Mobility ?Overal bed mobility: Modified Independent ?  ?  ?  ?  ?  ?  ?General bed mobility comments: increased time ?  ? ?Transfers ?Overall transfer level: Modified  independent ?  ?  ?  ?  ?  ?  ?  ?  ?General transfer comment: increased time, uses [facility] for community distances as needed ?  ? ?  ?Balance Overall balance assessment: Modified Independent ?  ?  ?  ?  ?  ?  ?  ?  ?  ?  ?  ?  ?  ?  ?  ?  ?  ?  ?   ? ?ADL either performed or assessed with clinical judgement  ? ?ADL Overall ADL's : At baseline;Modified independent ?  ?  ?  ?  ?  ?  ?  ?  ?  ?  ?  ?  ?  ?  ?  ?  ?  ?  ?  ?General ADL Comments: ADL amb completed with MOD I, grooming standing at sink completed with MOD I, UB/LBdressing tasks anticipated MOD I  ? ? ? ?Vision Patient Visual Report: No change from baseline ?   ?   ?Perception   ?  ?Praxis   ?  ? ?Pertinent Vitals/Pain Pain Assessment ?Pain Assessment: 0-10 ?Pain Score: 9  ?Pain Location: chronic lower back ?Pain Descriptors / Indicators: Aching ?Pain Intervention(s): Limited activity within patient's tolerance, Patient requesting pain meds-RN notified, Monitored during session, Repositioned (RN aware and preparing pain meds)  ? ? ? ?Hand Dominance   ?  ?Extremity/Trunk Assessment Upper Extremity Assessment ?Upper Extremity Assessment: Overall WFL for tasks assessed;RUE deficits/detail ?RUE Deficits / Details: no tremors noted during functional tasks, [facility] assessment; pt reports tremors with texting ?  ?Lower Extremity Assessment ?Lower Extremity Assessment: Overall WFL for tasks assessed ?  ?Cervical /  Trunk Assessment ?Cervical / Trunk Assessment: Normal ?  ?Communication Communication ?Communication: No difficulties ?  ?Cognition Arousal/Alertness: Awake/alert ?Behavior During Therapy: [facility] for tasks assessed/performed ?Overall Cognitive Status: Within Functional Limits for tasks assessed ?  ?  ?  ?  ?  ?  ?  ?  ?  ?  ?  ?  ?  ?  ?  ?  ?  ?  ?  ?General Comments  edema noted throughout BLE, pt reports he sees a lymphedema specialist through [facility]. He feels swelling is less than normal at this time. ? ?  ?Exercises Other Exercises ?Other Exercises: edu re: role  of OT, role of rehab, home safety, DME use ?  ?Shoulder Instructions    ? ? ?Home Living Family/patient expects to be discharged to:: Private residence ?Living Arrangements: Children ?Available Help at Discharge: Family;Available 24 hours/day ?Type of Home: House ?Home Access: Stairs to enter ?Entrance Stairs-Number of Steps: 1 threshold step ?  ?Home Layout: Two level;Other (Comment) ?  ?  ?Bathroom Shower/Tub: Tub/shower unit ?  ?Bathroom Toilet: Standard ?Bathroom Accessibility: Yes ?How Accessible: Accessible via walker ?Home Equipment: Grab bars - tub/shower;Cane - single point ?  ?  ?  ? ?  ?Prior Functioning/Environment Prior Level of Function : Independent/Modified Independent ?  ?  ?  ?  ?  ?  ?Mobility Comments: MOD I with [facility] for community distances ?ADLs Comments: MOD I-I in all ADL/IADL ?  ? ?  ?  ?OT Problem List:   ?  ?   ?OT Treatment/Interventions:    ?  ?OT Goals(Current goals can be found in the care plan section) Acute Rehab OT Goals ?Patient Stated Goal: go home ?OT Goal Formulation: With patient ?Time For Goal Achievement: 02/28/22 ?Potential to Achieve Goals: Good  ?OT Frequency:   ?  ? ?Co-evaluation   ?  ?  ?  ?  ? ?  ?AM-PAC OT "6 Clicks" Daily Activity     ?Outcome Measure Help from another person eating meals?: None ?Help from another person taking care of personal grooming?: None ?Help from another person toileting, which includes using toliet, bedpan, or urinal?: None ?Help from another person bathing (including washing, rinsing, drying)?: None ?Help from another person to put on and taking off regular upper body clothing?: None ?Help from another person to put on and taking off regular lower body clothing?: None ?6 Click Score: 24 ?  ?End of Session Nurse Communication: Mobility status ? ?Activity Tolerance: Patient tolerated treatment well ?Patient left: in bed;with call bell/phone within reach ? ?OT Visit Diagnosis: Unsteadiness on feet (R26.81)  ?              ?Time: [phone] ?OT  Time Calculation (min): 22 min ?Charges:  OT General Charges ?$OT Visit: 1 Visit ?OT Evaluation ?$OT Eval Low Complexity: 1 Low ? ?Shanon Payor, OTD OTR/L  ?02/14/22, 11:54 AM  ?

## 2022-02-14 NOTE — Care Management CC44 (Signed)
Condition Code 44 Documentation Completed ? ?Patient Details  ?Name: Preston Bryan ?MRN: [id number] ?Date of Birth: 1966/07/21 ? ? ?Condition Code 44 given:  Yes ?Patient signature on Condition Code 44 notice:  Yes ?Documentation of 2 MD's agreement:  Yes ?Code 44 added to claim:  Yes ? ? ? ?Candie Chroman, LCSW ?02/14/2022, 11:02 AM ? ?

## 2022-02-14 NOTE — Care Management Obs Status (Signed)
MEDICARE OBSERVATION STATUS NOTIFICATION ? ? ?Patient Details  ?Name: Preston Bryan ?MRN: [id number] ?Date of Birth: Dec 06, 1965 ? ? ?Medicare Observation Status Notification Given:  Yes ? ? ? ?Candie Chroman, LCSW ?02/14/2022, 11:02 AM ?

## 2022-02-14 NOTE — Progress Notes (Signed)
Admission profile updated. ?

## 2022-02-14 NOTE — TOC CM/SW Note (Signed)
Patient has orders to discharge home today. CSW acknowledges consult for HH/DME needs. Per patient, he is independent at home. PT screened out. His daughter will pick him up today. No further concerns. CSW signing off. ? ?Dayton Scrape, [place] ?[phone] ? ?

## 2022-02-17 ENCOUNTER — Other Ambulatory Visit: Payer: Self-pay | Admitting: Neurology

## 2022-02-17 DIAGNOSIS — M5416 Radiculopathy, lumbar region: Secondary | ICD-10-CM

## 2022-02-17 DIAGNOSIS — R471 Dysarthria and anarthria: Secondary | ICD-10-CM

## 2022-02-17 DIAGNOSIS — R531 Weakness: Secondary | ICD-10-CM

## 2022-02-17 DIAGNOSIS — R262 Difficulty in walking, not elsewhere classified: Secondary | ICD-10-CM

## 2022-08-27 ENCOUNTER — Encounter (INDEPENDENT_AMBULATORY_CARE_PROVIDER_SITE_OTHER): Payer: Self-pay

## 2023-03-22 ENCOUNTER — Ambulatory Visit (HOSPITAL_BASED_OUTPATIENT_CLINIC_OR_DEPARTMENT_OTHER): Payer: Medicare Other | Admitting: General Surgery
